# Patient Record
Sex: Female | Born: 1990 | Race: Black or African American | Hispanic: No | Marital: Single | Smoking: Former smoker
Health system: Southern US, Community
[De-identification: ages and names within clinical notes are randomized; demographics above are authoritative.]

## PROBLEM LIST (undated history)

## (undated) DIAGNOSIS — N186 End stage renal disease: Secondary | ICD-10-CM

## (undated) DIAGNOSIS — D649 Anemia, unspecified: Secondary | ICD-10-CM

## (undated) DIAGNOSIS — Z87891 Personal history of nicotine dependence: Secondary | ICD-10-CM

## (undated) DIAGNOSIS — R0600 Dyspnea, unspecified: Secondary | ICD-10-CM

## (undated) DIAGNOSIS — K219 Gastro-esophageal reflux disease without esophagitis: Secondary | ICD-10-CM

## (undated) DIAGNOSIS — I10 Essential (primary) hypertension: Secondary | ICD-10-CM

## (undated) DIAGNOSIS — Z992 Dependence on renal dialysis: Secondary | ICD-10-CM

## (undated) DIAGNOSIS — G4733 Obstructive sleep apnea (adult) (pediatric): Secondary | ICD-10-CM

## (undated) DIAGNOSIS — T7840XA Allergy, unspecified, initial encounter: Secondary | ICD-10-CM

## (undated) HISTORY — DX: Allergy, unspecified, initial encounter: T78.40XA

## (undated) HISTORY — DX: Anemia, unspecified: D64.9

---
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
## 2018-12-13 ENCOUNTER — Encounter: Payer: Self-pay | Admitting: Obstetrics and Gynecology

## 2018-12-13 ENCOUNTER — Other Ambulatory Visit: Payer: Self-pay

## 2018-12-13 ENCOUNTER — Ambulatory Visit (INDEPENDENT_AMBULATORY_CARE_PROVIDER_SITE_OTHER): Payer: PRIVATE HEALTH INSURANCE | Admitting: Obstetrics and Gynecology

## 2018-12-13 VITALS — BP 137/82 | HR 121 | Ht 66.0 in | Wt 301.8 lb

## 2018-12-13 DIAGNOSIS — O09891 Supervision of other high risk pregnancies, first trimester: Secondary | ICD-10-CM | POA: Diagnosis not present

## 2018-12-13 DIAGNOSIS — Z6841 Body Mass Index (BMI) 40.0 and over, adult: Secondary | ICD-10-CM

## 2018-12-13 DIAGNOSIS — N926 Irregular menstruation, unspecified: Secondary | ICD-10-CM | POA: Diagnosis not present

## 2018-12-13 DIAGNOSIS — Z98891 History of uterine scar from previous surgery: Secondary | ICD-10-CM

## 2018-12-13 NOTE — Progress Notes (Signed)
HPI:      Ms. Sierra Lee is a 28 y.o. [id number] who LMP was Patient's last menstrual period was 10/23/2018.  Subjective:   She presents today for pregnancy confirmation.  She believes she is approximately [redacted] weeks pregnant.  She has had 4 previous cesarean deliveries.  First delivery was complicated by the "size of her pelvis" and preeclampsia.  She has not had the diagnosis of preeclampsia since that first pregnancy. She is not taking prenatal vitamins. She describes a family history of diabetes. She has a very short interval between her previous pregnancy and this pregnancy. She is considering permanent sterilization or at the very least long-term contraception after this pregnancy.    Hx: The following portions of the patient's history were reviewed and updated as appropriate:             She  has a past medical history of Anemia. She does not have a problem list on file. She  has a past surgical history that includes Cesarean section. Her family history includes Breast cancer in her maternal aunt and maternal grandmother; Diabetes in her mother. She  reports that she quit smoking about 2 months ago. Her smoking use included cigarettes. She has never used smokeless tobacco. She reports previous alcohol use. She reports that she does not use drugs. She has a current medication list which includes the following prescription(s): ferrous sulfate. She has No Known Allergies.       Review of Systems:  Review of Systems  Constitutional: Denied constitutional symptoms, night sweats, recent illness, fatigue, fever, insomnia and weight loss.  Eyes: Denied eye symptoms, eye pain, photophobia, vision change and visual disturbance.  Ears/Nose/Throat/Neck: Denied ear, nose, throat or neck symptoms, hearing loss, nasal discharge, sinus congestion and sore throat.  Cardiovascular: Denied cardiovascular symptoms, arrhythmia, chest pain/pressure, edema, exercise intolerance, orthopnea and palpitations.   Respiratory: Denied pulmonary symptoms, asthma, pleuritic pain, productive sputum, cough, dyspnea and wheezing.  Gastrointestinal: Denied, gastro-esophageal reflux, melena, nausea and vomiting.  Genitourinary: Denied genitourinary symptoms including symptomatic vaginal discharge, pelvic relaxation issues, and urinary complaints.  Musculoskeletal: Denied musculoskeletal symptoms, stiffness, swelling, muscle weakness and myalgia.  Dermatologic: Denied dermatology symptoms, rash and scar.  Neurologic: Denied neurology symptoms, dizziness, headache, neck pain and syncope.  Psychiatric: Denied psychiatric symptoms, anxiety and depression.  Endocrine: Denied endocrine symptoms including hot flashes and night sweats.   Meds:   Current Outpatient Medications on File Prior to Visit  Medication Sig Dispense Refill  . ferrous sulfate 325 (65 FE) MG tablet Take 325 mg by mouth daily with breakfast.     No current facility-administered medications on file prior to visit.     Objective:     Vitals:   12/13/18 0817  BP: 137/82  Pulse: (!) 121              Urinary pregnancy test positive  Assessment:    [id number] There are no active problems to display for this patient.    1. Missed menses   2. Morbid obesity with BMI of 45.0-49.9, adult ([place])   3. History of Cesarean delivery X4   4. Short interval between pregnancies affecting pregnancy in first trimester, antepartum     Patient at high risk for gestational diabetes, increased risk for placenta previa/accreta.   Plan:             Prenatal Plan 1.  The patient was given prenatal literature. 2.  She was begun on prenatal vitamins. 3.  A prenatal lab  panel was ordered or drawn. 4.  An ultrasound was ordered to better determine an EDC. 5.  A nurse visit was scheduled. 6.  Genetic testing and testing for other inheritable conditions discussed in detail. She will decide in the future whether to have these labs performed. 7.  A  general overview of pregnancy testing, visit schedule, ultrasound schedule, and prenatal care was discussed. 8.  Benefits of breast-feeding discussed in detail including both maternal and infant benefits. Ready Set Baby website discussed.   Orders No orders of the defined types were placed in this encounter.   No orders of the defined types were placed in this encounter.     F/U  Return in about 5 weeks (around 01/17/2019). I spent 33 minutes involved in the care of this patient of which greater than 50% was spent discussing see above prenatal plan as well as increased risk for diabetes, problems with obesity, previous cesarean delivery x4 and previous obstetric history obtained, possible permanent sterilization versus long-term contraception discussed.  All questions answered.  Elonda Husky, M.D. 12/13/2018 8:55 AM

## 2018-12-21 ENCOUNTER — Other Ambulatory Visit: Payer: Self-pay

## 2018-12-25 ENCOUNTER — Telehealth: Payer: Self-pay

## 2018-12-25 NOTE — Telephone Encounter (Signed)
Early OB 9 weeks-   Out shopping  Went to the bathroom and had blood in your underwear. Dime size. No cramps. Last IC 10 days ago.  Light spotting when she wipes today. NO pain with urination. No issues with BM.   Took Ibup yesterday - slight discomfort in stomach. Better today.  Pt advised if spotting becomes heavy or cramps not relieved with tylenol she needs to contact the office or go to the ED. Pt voices understanding.

## 2018-12-26 ENCOUNTER — Ambulatory Visit (INDEPENDENT_AMBULATORY_CARE_PROVIDER_SITE_OTHER): Payer: PRIVATE HEALTH INSURANCE

## 2018-12-26 ENCOUNTER — Other Ambulatory Visit: Payer: Self-pay

## 2018-12-26 DIAGNOSIS — O09891 Supervision of other high risk pregnancies, first trimester: Secondary | ICD-10-CM | POA: Diagnosis not present

## 2018-12-26 DIAGNOSIS — N926 Irregular menstruation, unspecified: Secondary | ICD-10-CM | POA: Diagnosis not present

## 2018-12-26 DIAGNOSIS — Z98891 History of uterine scar from previous surgery: Secondary | ICD-10-CM

## 2019-01-17 ENCOUNTER — Encounter: Payer: Self-pay | Admitting: Obstetrics and Gynecology

## 2019-02-15 ENCOUNTER — Telehealth: Payer: Self-pay | Admitting: Obstetrics and Gynecology

## 2019-02-20 ENCOUNTER — Ambulatory Visit (INDEPENDENT_AMBULATORY_CARE_PROVIDER_SITE_OTHER): Payer: Medicaid Other | Admitting: Obstetrics and Gynecology

## 2019-02-20 ENCOUNTER — Other Ambulatory Visit: Payer: Self-pay

## 2019-02-20 ENCOUNTER — Encounter: Payer: Self-pay | Admitting: Obstetrics and Gynecology

## 2019-02-20 VITALS — BP 123/68 | HR 96 | Wt 318.2 lb

## 2019-02-20 DIAGNOSIS — O34219 Maternal care for unspecified type scar from previous cesarean delivery: Secondary | ICD-10-CM | POA: Diagnosis not present

## 2019-02-20 DIAGNOSIS — Z98891 History of uterine scar from previous surgery: Secondary | ICD-10-CM

## 2019-02-20 DIAGNOSIS — Z6841 Body Mass Index (BMI) 40.0 and over, adult: Secondary | ICD-10-CM

## 2019-02-20 DIAGNOSIS — Z3492 Encounter for supervision of normal pregnancy, unspecified, second trimester: Secondary | ICD-10-CM

## 2019-02-20 DIAGNOSIS — Z3A18 18 weeks gestation of pregnancy: Secondary | ICD-10-CM

## 2019-02-20 LAB — POCT URINALYSIS DIPSTICK OB
Bilirubin, UA: NEGATIVE
Glucose, UA: NEGATIVE
Ketones, UA: NEGATIVE
Leukocytes, UA: NEGATIVE
Nitrite, UA: POSITIVE
Spec Grav, UA: 1.01 (ref 1.010–1.025)
Urobilinogen, UA: 0.2 E.U./dL
pH, UA: 7 (ref 5.0–8.0)

## 2019-02-20 NOTE — Progress Notes (Signed)
NOB: Patient did not return for her new OB visit.  This is her first visit since confirmation.  She reports no problems.   Maternity 21, AFP today   FAS scheduled   Anesthesia consult scheduled  Physical examination General NAD, Conversant  HEENT Atraumatic; Op clear with mmm.  Normo-cephalic. Pupils reactive. Anicteric sclerae  Thyroid/Neck Smooth without nodularity or enlargement. Normal ROM.  Neck Supple.  Skin No rashes, lesions or ulceration. Normal palpated skin turgor. No nodularity.  Breasts: No masses or discharge.  Symmetric.  No axillary adenopathy.  Lungs: Clear to auscultation.No rales or wheezes. Normal Respiratory effort, no retractions.  Heart: NSR.  No murmurs or rubs appreciated. No periferal edema  Abdomen: Soft.  Non-tender.  No masses.  No HSM. No hernia  Extremities: Moves all appropriately.  Normal ROM for age. No lymphadenopathy.  Neuro: Oriented to PPT.  Normal mood. Normal affect.     Pelvic:   Vulva: Normal appearance.  No lesions.  Vagina: No lesions or abnormalities noted.  Support: Normal pelvic support.  Urethra No masses tenderness or scarring.  Meatus Normal size without lesions or prolapse.  Cervix: Normal appearance.  No lesions.  Anus: Normal exam.  No lesions.  Perineum: Normal exam.  No lesions.        Bimanual   Adnexae: No masses.  Non-tender to palpation.  Uterus: Enlarged. 18 wks  Non-tender.  Mobile.  AV.  Adnexae: No masses.  Non-tender to palpation.  Cul-de-sac: Negative for abnormality.  Adnexae: No masses.  Non-tender to palpation.         Pelvimetry   Diagonal: Reached.  Spines: Average.  Sacrum: Concave.  Pubic Arch: Normal.   The following were addressed during this visit:  Breastfeeding Education - Early initiation of breastfeeding    Comments: Keeps milk supply adequate, helps contract uterus and slow bleeding, and early milk is the perfect first food and is easy to digest.   - The importance of exclusive breastfeeding     Comments: Provides antibodies, Lower risk of breast and ovarian cancers, and type-2 diabetes,Helps your body recover, Reduced chance of SIDS.   - Exclusive breastfeeding for the first 6 months    Comments: Builds a healthy milk supply and keeps it up, protects baby from sickness and disease, and breastmilk has everything your baby needs for the first 6 months.  - Individualized Education    Comments: Contraindications to breastfeeding and other special medical conditions

## 2019-02-21 LAB — URINALYSIS, ROUTINE W REFLEX MICROSCOPIC
Bilirubin, UA: NEGATIVE
Glucose, UA: NEGATIVE
Ketones, UA: NEGATIVE
Leukocytes,UA: NEGATIVE
Nitrite, UA: POSITIVE — AB
RBC, UA: NEGATIVE
Specific Gravity, UA: 1.023 (ref 1.005–1.030)
Urobilinogen, Ur: 0.2 mg/dL (ref 0.2–1.0)
pH, UA: 6 (ref 5.0–7.5)

## 2019-02-21 LAB — MICROSCOPIC EXAMINATION: Casts: NONE SEEN /lpf

## 2019-02-21 LAB — MONITOR DRUG PROFILE 14(MW)
Amphetamine Scrn, Ur: NEGATIVE ng/mL
BARBITURATE SCREEN URINE: NEGATIVE ng/mL
BENZODIAZEPINE SCREEN, URINE: NEGATIVE ng/mL
Buprenorphine, Urine: NEGATIVE ng/mL
CANNABINOIDS UR QL SCN: NEGATIVE ng/mL
Cocaine (Metab) Scrn, Ur: NEGATIVE ng/mL
Creatinine(Crt), U: 225.6 mg/dL (ref 20.0–300.0)
Fentanyl, Urine: NEGATIVE pg/mL
Meperidine Screen, Urine: NEGATIVE ng/mL
Methadone Screen, Urine: NEGATIVE ng/mL
OXYCODONE+OXYMORPHONE UR QL SCN: NEGATIVE ng/mL
Opiate Scrn, Ur: NEGATIVE ng/mL
Ph of Urine: 5.8 (ref 4.5–8.9)
Phencyclidine Qn, Ur: NEGATIVE ng/mL
Propoxyphene Scrn, Ur: NEGATIVE ng/mL
SPECIFIC GRAVITY: 1.021
Tramadol Screen, Urine: NEGATIVE ng/mL

## 2019-02-21 LAB — NICOTINE SCREEN, URINE: Cotinine Ql Scrn, Ur: NEGATIVE ng/mL

## 2019-02-22 LAB — ANTIBODY SCREEN: Antibody Screen: NEGATIVE

## 2019-02-22 LAB — AFP, SERUM, OPEN SPINA BIFIDA
AFP MoM: 0.99
AFP Value: 30.9 ng/mL
Gest. Age on Collection Date: 18 weeks
Maternal Age At EDD: 26.5 yr
OSBR Risk 1 IN: 10000
Test Results:: NEGATIVE
Weight: 318 [lb_av]

## 2019-02-22 LAB — CBC WITH DIFFERENTIAL/PLATELET
Basophils Absolute: 0 10*3/uL (ref 0.0–0.2)
Basos: 0 %
EOS (ABSOLUTE): 0.1 10*3/uL (ref 0.0–0.4)
Eos: 1 %
Hematocrit: 32.9 % — ABNORMAL LOW (ref 34.0–46.6)
Hemoglobin: 10.6 g/dL — ABNORMAL LOW (ref 11.1–15.9)
Immature Grans (Abs): 0 10*3/uL (ref 0.0–0.1)
Immature Granulocytes: 0 %
Lymphocytes Absolute: 2.9 10*3/uL (ref 0.7–3.1)
Lymphs: 26 %
MCH: 25.6 pg — ABNORMAL LOW (ref 26.6–33.0)
MCHC: 32.2 g/dL (ref 31.5–35.7)
MCV: 80 fL (ref 79–97)
Monocytes Absolute: 0.6 10*3/uL (ref 0.1–0.9)
Monocytes: 5 %
Neutrophils Absolute: 7.5 10*3/uL — ABNORMAL HIGH (ref 1.4–7.0)
Neutrophils: 68 %
Platelets: 342 10*3/uL (ref 150–450)
RBC: 4.14 x10E6/uL (ref 3.77–5.28)
RDW: 15.8 % — ABNORMAL HIGH (ref 11.7–15.4)
WBC: 11.1 10*3/uL — ABNORMAL HIGH (ref 3.4–10.8)

## 2019-02-22 LAB — HIV ANTIBODY (ROUTINE TESTING W REFLEX): HIV Screen 4th Generation wRfx: NONREACTIVE

## 2019-02-22 LAB — RUBELLA SCREEN: Rubella Antibodies, IGG: 2.7 index (ref >0.99)

## 2019-02-22 LAB — VARICELLA ZOSTER ANTIBODY, IGG: Varicella zoster IgG: <135 index — ABNORMAL LOW (ref >165)

## 2019-02-22 LAB — HEPATITIS B SURFACE ANTIGEN: Hepatitis B Surface Ag: NEGATIVE

## 2019-02-22 LAB — GC/CHLAMYDIA PROBE AMP
Chlamydia trachomatis, NAA: NEGATIVE
Neisseria Gonorrhoeae by PCR: NEGATIVE

## 2019-02-22 LAB — ABO AND RH: Rh Factor: POSITIVE

## 2019-02-22 LAB — RPR: RPR Ser Ql: NONREACTIVE

## 2019-02-22 LAB — HGB SOLU + RFLX FRAC: Sickle Solubility Test - HGBRFX: NEGATIVE

## 2019-02-23 LAB — URINE CULTURE, OB REFLEX

## 2019-02-23 LAB — CULTURE, OB URINE

## 2019-02-27 NOTE — Telephone Encounter (Signed)
na

## 2019-02-28 ENCOUNTER — Other Ambulatory Visit: Payer: Self-pay | Admitting: Surgical

## 2019-02-28 LAB — MATERNIT21  PLUS CORE+ESS+SCA, BLOOD

## 2019-02-28 MED ORDER — NITROFURANTOIN MONOHYD MACRO 100 MG PO CAPS: 100.0000 mg | ORAL_CAPSULE | Freq: Two times a day (BID) | ORAL | 0 refills | Status: DC

## 2019-03-06 ENCOUNTER — Other Ambulatory Visit: Payer: PRIVATE HEALTH INSURANCE

## 2019-03-13 ENCOUNTER — Other Ambulatory Visit: Payer: PRIVATE HEALTH INSURANCE

## 2019-03-16 NOTE — L&D Delivery Note (Signed)
Delivery Summary for [facility]  Labor Events:   Preterm labor: No data found  Rupture date: No data found  Rupture time: No data found  Rupture type: No data found  Fluid Color: No data found  Induction: No data found  Augmentation: No data found  Complications: No data found  Cervical ripening: No data found No data found   No data found     Delivery:   Episiotomy: No data found  Lacerations: No data found  Repair suture: No data found  Repair # of packets: No data found  Blood loss (ml): No data found   Information for the patient's newborn:  Saliha, [place] [[id number]]    Delivery 07/16/2019 11:12 AM by  C-Section, Low Transverse Sex:  female Gestational Age: [redacted]w[redacted]d Delivery Clinician:   Living?:         APGARS  One minute Five minutes Ten minutes  Skin color:        Heart rate:        Grimace:        Muscle tone:        Breathing:        Totals: 6  7  8     Presentation/position:      Resuscitation:   Cord information:    Disposition of cord blood:     Blood gases sent?  Complications:   Placenta: Delivered:       appearance Newborn Measurements: Weight: 7 lb 12.9 oz (3540 g)  Height:    Head circumference:    Chest circumference:    Other providers:    Additional  information: Forceps:   Vacuum:   Breech:   Observed anomalies       See Dr. operative note for details of C-section.    Oretha Milch, MD [facility]

## 2019-03-20 ENCOUNTER — Encounter: Payer: PRIVATE HEALTH INSURANCE | Admitting: Obstetrics and Gynecology

## 2019-03-27 ENCOUNTER — Encounter
Admission: RE | Admit: 2019-03-27 | Discharge: 2019-03-27 | Disposition: A | Discharge status: Home / Self Care | Payer: PRIVATE HEALTH INSURANCE | Source: Ambulatory Visit | Attending: Anesthesiology | Admitting: Anesthesiology

## 2019-03-27 ENCOUNTER — Other Ambulatory Visit: Payer: Self-pay

## 2019-03-27 NOTE — Consult Note (Signed)
[facility] Anesthesia Consultation  Sierra Lee YKZ:[id number] DOB: 1990-10-16 DOA: 03/27/2019 PCP: Patient, No Pcp Per   Requesting physician: Dr. Logan Bores Date of consultation: 03/27/19 Reason for consultation: Obesity during pregnancy  CHIEF COMPLAINT:  Obesity during pregnancy  HISTORY OF PRESENT ILLNESS: Sierra Lee  is a 29 y.o. female with a known history of obesity in pregnancy. She has had 4 prior cesarean deliveries (all done in [place]), planning for repeat cesarean delivery. Denies hx of cardiovascular disease. Denies hx of asthma. Denies personal or family hx of bleeding disorders.  PAST MEDICAL HISTORY:   Past Medical History:  Diagnosis Date  . Anemia     PAST SURGICAL HISTORY:  Past Surgical History:  Procedure Laterality Date  . CESAREAN SECTION      SOCIAL HISTORY:  Social History   Tobacco Use  . Smoking status: Former Smoker    Types: Cigarettes    Quit date: 10/12/2018    Years since quitting: 0.4  . Smokeless tobacco: Never Used  Substance Use Topics  . Alcohol use: Not Currently    FAMILY HISTORY:  Family History  Problem Relation Age of Onset  . Diabetes Mother   . Breast cancer Maternal Aunt   . Breast cancer Maternal Grandmother     DRUG ALLERGIES: No Known Allergies  REVIEW OF SYSTEMS:   RESPIRATORY: No cough, shortness of breath, wheezing.  CARDIOVASCULAR: No chest pain, orthopnea, edema.  HEMATOLOGY: No anemia, easy bruising or bleeding SKIN: No rash or lesion. NEUROLOGIC: No tingling, numbness, weakness.  PSYCHIATRY: No anxiety or depression.   MEDICATIONS AT HOME:  Prior to Admission medications   Medication Sig Start Date End Date Taking? Authorizing Provider  ferrous sulfate 325 (65 FE) MG tablet Take 325 mg by mouth daily with breakfast.    [provider]  nitrofurantoin, macrocrystal-monohydrate, (MACROBID) 100 MG capsule Take 1 capsule (100 mg total) by mouth 2 (two)  times daily. 02/28/19   Linzie Collin, MD  Prenatal Vit-Fe Fumarate-FA (PRENATAL MULTIVITAMIN) TABS tablet Take 1 tablet by mouth daily at 12 noon.    [provider]      PHYSICAL EXAMINATION:   VITAL SIGNS: Last menstrual period 10/23/2018.  GENERAL:  29 y.o.-year-old patient no acute distress.  HEENT: Head atraumatic, normocephalic. Oropharynx and nasopharynx clear. MP 2, TM distance >3 cm, normal mouth opening, grade 1 upper lip bite. LUNGS: Normal breath sounds bilaterally, no wheezing, rales,rhonchi. No use of accessory muscles of respiration.  CARDIOVASCULAR: S1, S2 normal. No murmurs, rubs, or gallops.  EXTREMITIES: No pedal edema, cyanosis, or clubbing.  NEUROLOGIC: normal gait PSYCHIATRIC: The patient is alert and oriented x 3.  SKIN: No obvious rash, lesion, or ulcer.    IMPRESSION AND PLAN:   Sierra Lee  is a 28 y.o. female presenting with obesity during pregnancy. BMI is currently 52 at [redacted] weeks gestation.   Airway exam reassuring today. Spinal interspaces minimally palpable.  Discussed plan for spinal anesthesia for repeat cesarean delivery. Discussed increased risk of difficult intubation during pregnancy should an emergency cesarean delivery be required.   We discussed repeat evaluation at 34 weeks by anesthesia to determine whether there is a high risk of complications of anesthesia for which we would recommend transfer of OB care to a facility with a higher maternal level of care designation.

## 2019-04-03 ENCOUNTER — Encounter: Payer: Medicaid Other | Admitting: Obstetrics and Gynecology

## 2019-04-03 ENCOUNTER — Other Ambulatory Visit: Payer: Self-pay

## 2019-04-04 ENCOUNTER — Encounter: Payer: PRIVATE HEALTH INSURANCE | Admitting: Obstetrics and Gynecology

## 2019-04-12 NOTE — Progress Notes (Deleted)
Rob-Pt present for routine prenatal care.

## 2019-04-13 ENCOUNTER — Encounter: Payer: PRIVATE HEALTH INSURANCE | Admitting: Obstetrics and Gynecology

## 2019-04-16 ENCOUNTER — Telehealth: Payer: Self-pay | Admitting: Obstetrics and Gynecology

## 2019-04-16 NOTE — Telephone Encounter (Signed)
Pt called in the pt missed there appt and the pt is off this wed. The pt was wanting to come in but do [facility] is full. where would you want to out her? She did say she can do the 16th but the pt is 26 weeks.  Please advise

## 2019-04-18 NOTE — Telephone Encounter (Signed)
Pt called no answer no vm picked up.

## 2019-04-23 NOTE — Telephone Encounter (Signed)
Pt called to remind pt of appointment and to schedule an u/s for anatomy that she missed.

## 2019-04-30 NOTE — Patient Instructions (Addendum)
Third Trimester of Pregnancy  The third trimester is from week 28 through week 40 (months 7 through 9). This trimester is when your unborn baby (fetus) is growing very fast. At the end of the ninth month, the unborn baby is about 20 inches in length. It weighs about 6-10 pounds. Follow these instructions at home: Medicines  Take over-the-counter and prescription medicines only as told by your doctor. Some medicines are safe and some medicines are not safe during pregnancy.  Take a prenatal vitamin that contains at least 600 micrograms (mcg) of folic acid.  If you have trouble pooping (constipation), take medicine that will make your stool soft (stool softener) if your doctor approves. Eating and drinking   Eat regular, healthy meals.  Avoid raw meat and uncooked cheese.  If you get low calcium from the food you eat, talk to your doctor about taking a daily calcium supplement.  Eat four or five small meals rather than three large meals a day.  Avoid foods that are high in fat and sugars, such as fried and sweet foods.  To prevent constipation: ? Eat foods that are high in fiber, like fresh fruits and vegetables, whole grains, and beans. ? Drink enough fluids to keep your pee (urine) clear or pale yellow. Activity  Exercise only as told by your doctor. Stop exercising if you start to have cramps.  Avoid heavy lifting, wear low heels, and sit up straight.  Do not exercise if it is too hot, too humid, or if you are in a place of great height (high altitude).  You may continue to have sex unless your doctor tells you not to. Relieving pain and discomfort  Wear a good support bra if your breasts are tender.  Take frequent breaks and rest with your legs raised if you have leg cramps or low back pain.  Take warm water baths (sitz baths) to soothe pain or discomfort caused by hemorrhoids. Use hemorrhoid cream if your doctor approves.  If you develop puffy, bulging veins (varicose  veins) in your legs: ? Wear support hose or compression stockings as told by your doctor. ? Raise (elevate) your feet for 15 minutes, 3-4 times a day. ? Limit salt in your food. Safety  Wear your seat belt when driving.  Make a list of emergency phone numbers, including numbers for family, friends, the hospital, and police and fire departments. Preparing for your baby's arrival To prepare for the arrival of your baby:  Take prenatal classes.  Practice driving to the hospital.  Visit the hospital and tour the maternity area.  Talk to your work about taking leave once the baby comes.  Pack your hospital bag.  Prepare the baby's room.  Go to your doctor visits.  Buy a rear-facing car seat. Learn how to install it in your car. General instructions  Do not use hot tubs, steam rooms, or saunas.  Do not use any products that contain nicotine or tobacco, such as cigarettes and e-cigarettes. If you need help quitting, ask your doctor.  Do not drink alcohol.  Do not douche or use tampons or scented sanitary pads.  Do not cross your legs for long periods of time.  Do not travel for long distances unless you must. Only do so if your doctor says it is okay.  Visit your dentist if you have not gone during your pregnancy. Use a soft toothbrush to brush your teeth. Be gentle when you floss.  Avoid cat litter boxes and soil   used by cats. These carry germs that can cause birth defects in the baby and can cause a loss of your baby (miscarriage) or stillbirth.  Keep all your prenatal visits as told by your doctor. This is important. Contact a doctor if:  You are not sure if you are in labor or if your water has broken.  You are dizzy.  You have mild cramps or pressure in your lower belly.  You have a nagging pain in your belly area.  You continue to feel sick to your stomach, you throw up, or you have watery poop.  You have bad smelling fluid coming from your vagina.  You have  pain when you pee. Get help right away if:  You have a fever.  You are leaking fluid from your vagina.  You are spotting or bleeding from your vagina.  You have severe belly cramps or pain.  You lose or gain weight quickly.  You have trouble catching your breath and have chest pain.  You notice sudden or extreme puffiness (swelling) of your face, hands, ankles, feet, or legs.  You have not felt the baby move in over an hour.  You have severe headaches that do not go away with medicine.  You have trouble seeing.  You are leaking, or you are having a gush of fluid, from your vagina before you are 37 weeks.  You have regular belly spasms (contractions) before you are 37 weeks. Summary  The third trimester is from week 28 through week 40 (months 7 through 9). This time is when your unborn baby is growing very fast.  Follow your doctor's advice about medicine, food, and activity.  Get ready for the arrival of your baby by taking prenatal classes, getting all the baby items ready, preparing the baby's room, and visiting your doctor to be checked.  Get help right away if you are bleeding from your vagina, or you have chest pain and trouble catching your breath, or if you have not felt your baby move in over an hour. This information is not intended to replace advice given to you by your health care provider. Make sure you discuss any questions you have with your health care provider. Document Revised: 06/22/2018 Document Reviewed: 04/06/2016 Elsevier Patient Education  2020 [employer]. Breastfeeding and Self-Care It is normal to have some problems when you start to breastfeed your new baby. But there are things that you can do to take care of yourself and help prevent many common problems. This includes keeping your breasts healthy and making sure that your baby's mouth attaches (latches) properly to your nipple for feedings. Work with your doctor or breastfeeding specialist  (Advertising copywriter) to find what works best for you. Follow these instructions at home: Breastfeeding strategy   Always make sure that your baby latches properly to breastfeed.  Make sure that your baby is in a proper position. Try different breastfeeding positions to find one that works best for you and your baby.  Breastfeed when you feel like you need to make your breasts less full or when your baby shows signs of hunger. This is called "breastfeeding on demand."  Do not delay feedings.  Try to relax when it is time to feed your baby. This helps your body release milk from your breast.  To help increase milk flow: ? Remove a small amount of milk from your breast right before breastfeeding. Do this using a pump or by squeezing with your hand. ? Apply warm,  moist heat to your breast right before feeding. You can do this in the shower or with hand towels soaked with warm water. ? Massage your breast right before or during feeding. Breast care   To help your breasts stay healthy and keep them from getting too dry: ? Avoid using soap on your nipples. ? Let your nipples air-dry for 3-4 minutes after each feeding. ? Use only cotton bra pads to soak up breast milk that leaks. Be sure to change the pads if they become soaked with milk. If you use bra pads that can be thrown away, change them often. ? Put some lanolin on your nipples after breastfeeding. Pure lanolin does not need to be washed off your nipple before you feed your baby again. Pure lanolin is not harmful to your baby. ? Rub some breast milk into your nipples:  Use your hand to squeeze out a few drops of breast milk.  Gently massage the milk into your nipples.  Let your nipples air-dry.  Wear a supportive nursing bra. Avoid wearing: ? Tight clothing. ? Underwire bras or bras that put pressure on your breasts.  Use ice to help relieve pain or swelling of your breasts: ? Put ice in a plastic bag. ? Place a towel  between your skin and the bag. ? Leave the ice on for 20 minutes, 2-3 times a day. General instructions  Drink enough fluid to keep your pee (urine) pale yellow.  Get plenty of rest. Sleep when your baby sleeps.  Talk to your doctor or breastfeeding specialist before taking any herbal supplements. Contact a health care provider if:  You have nipple pain.  You have cracking or soreness in your nipples that lasts longer than 1 week.  Your breasts are overfilled with milk (engorgement) and this lasts longer than 48 hours.  You have a fever.  You have pus-like fluid coming from your nipple.  You have redness, a rash, swelling, itching, or burning on your breast.  Your baby does not gain weight.  Your baby loses weight. Summary  There are things that you can do to take care of yourself and help prevent many common breastfeeding problems.  Always make sure that your baby's mouth attaches (latches) to your nipple properly to breastfeed.  Keep your nipples from getting too dry, drink plenty of fluid, and get plenty of rest.  Feed on demand. Do not delay feedings. This information is not intended to replace advice given to you by your health care provider. Make sure you discuss any questions you have with your health care provider. Document Revised: 06/21/2018 Document Reviewed: 10/06/2016 Elsevier Patient Education  2020 [employer] Breastfeeding  Choosing to breastfeed is one of the best decisions you can make for yourself and your baby. A change in hormones during pregnancy causes your breasts to make breast milk in your milk-producing glands. Hormones prevent breast milk from being released before your baby is born. They also prompt milk flow after birth. Once breastfeeding has begun, thoughts of your baby, as well as his or her sucking or crying, can stimulate the release of milk from your milk-producing glands. Benefits of breastfeeding Research shows that breastfeeding  offers many health benefits for infants and mothers. It also offers a cost-free and convenient way to feed your baby. For your baby  Your first milk (colostrum) helps your baby's digestive system to function better.  Special cells in your milk (antibodies) help your baby to fight off infections.  Breastfed babies  are less likely to develop asthma, allergies, obesity, or type 2 diabetes. They are also at lower risk for sudden infant death syndrome (SIDS).  Nutrients in breast milk are better able to meet your baby's needs compared to infant formula.  Breast milk improves your baby's brain development. For you  Breastfeeding helps to create a very special bond between you and your baby.  Breastfeeding is convenient. Breast milk costs nothing and is always available at the correct temperature.  Breastfeeding helps to burn calories. It helps you to lose the weight that you gained during pregnancy.  Breastfeeding makes your uterus return faster to its size before pregnancy. It also slows bleeding (lochia) after you give birth.  Breastfeeding helps to lower your risk of developing type 2 diabetes, osteoporosis, rheumatoid arthritis, cardiovascular disease, and breast, ovarian, uterine, and endometrial cancer later in life. Breastfeeding basics Starting breastfeeding  Find a comfortable place to sit or lie down, with your neck and back well-supported.  Place a pillow or a rolled-up blanket under your baby to bring him or her to the level of your breast (if you are seated). Nursing pillows are specially designed to help support your arms and your baby while you breastfeed.  Make sure that your baby's tummy (abdomen) is facing your abdomen.  Gently massage your breast. With your fingertips, massage from the outer edges of your breast inward toward the nipple. This encourages milk flow. If your milk flows slowly, you may need to continue this action during the feeding.  Support your breast  with 4 fingers underneath and your thumb above your nipple (make the letter "C" with your hand). Make sure your fingers are well away from your nipple and your baby's mouth.  Stroke your baby's lips gently with your finger or nipple.  When your baby's mouth is open wide enough, quickly bring your baby to your breast, placing your entire nipple and as much of the areola as possible into your baby's mouth. The areola is the colored area around your nipple. ? More areola should be visible above your baby's upper lip than below the lower lip. ? Your baby's lips should be opened and extended outward (flanged) to ensure an adequate, comfortable latch. ? Your baby's tongue should be between his or her lower gum and your breast.  Make sure that your baby's mouth is correctly positioned around your nipple (latched). Your baby's lips should create a seal on your breast and be turned out (everted).  It is common for your baby to suck about 2-3 minutes in order to start the flow of breast milk. Latching Teaching your baby how to latch onto your breast properly is very important. An improper latch can cause nipple pain, decreased milk supply, and poor weight gain in your baby. Also, if your baby is not latched onto your nipple properly, he or she may swallow some air during feeding. This can make your baby fussy. Burping your baby when you switch breasts during the feeding can help to get rid of the air. However, teaching your baby to latch on properly is still the best way to prevent fussiness from swallowing air while breastfeeding. Signs that your baby has successfully latched onto your nipple  Silent tugging or silent sucking, without causing you pain. Infant's lips should be extended outward (flanged).  Swallowing heard between every 3-4 sucks once your milk has started to flow (after your let-down milk reflex occurs).  Muscle movement above and in front of his or her  ears while sucking. Signs that your  baby has not successfully latched onto your nipple  Sucking sounds or smacking sounds from your baby while breastfeeding.  Nipple pain. If you think your baby has not latched on correctly, slip your finger into the corner of your baby's mouth to break the suction and place it between your baby's gums. Attempt to start breastfeeding again. Signs of successful breastfeeding Signs from your baby  Your baby will gradually decrease the number of sucks or will completely stop sucking.  Your baby will fall asleep.  Your baby's body will relax.  Your baby will retain a small amount of milk in his or her mouth.  Your baby will let go of your breast by himself or herself. Signs from you  Breasts that have increased in firmness, weight, and size 1-3 hours after feeding.  Breasts that are softer immediately after breastfeeding.  Increased milk volume, as well as a change in milk consistency and color by the fifth day of breastfeeding.  Nipples that are not sore, cracked, or bleeding. Signs that your baby is getting enough milk  Wetting at least 1-2 diapers during the first 24 hours after birth.  Wetting at least 5-6 diapers every 24 hours for the first week after birth. The urine should be clear or pale yellow by the age of 5 days.  Wetting 6-8 diapers every 24 hours as your baby continues to grow and develop.  At least 3 stools in a 24-hour period by the age of 5 days. The stool should be soft and yellow.  At least 3 stools in a 24-hour period by the age of 7 days. The stool should be seedy and yellow.  No loss of weight greater than 10% of birth weight during the first 3 days of life.  Average weight gain of 4-7 oz (113-198 g) per week after the age of 4 days.  Consistent daily weight gain by the age of 5 days, without weight loss after the age of 2 weeks. After a feeding, your baby may spit up a small amount of milk. This is normal. Breastfeeding frequency and duration Frequent  feeding will help you make more milk and can prevent sore nipples and extremely full breasts (breast engorgement). Breastfeed when you feel the need to reduce the fullness of your breasts or when your baby shows signs of hunger. This is called "breastfeeding on demand." Signs that your baby is hungry include:  Increased alertness, activity, or restlessness.  Movement of the head from side to side.  Opening of the mouth when the corner of the mouth or cheek is stroked (rooting).  Increased sucking sounds, smacking lips, cooing, sighing, or squeaking.  Hand-to-mouth movements and sucking on fingers or hands.  Fussing or crying. Avoid introducing a pacifier to your baby in the first 4-6 weeks after your baby is born. After this time, you may choose to use a pacifier. Research has shown that pacifier use during the first year of a baby's life decreases the risk of sudden infant death syndrome (SIDS). Allow your baby to feed on each breast as long as he or she wants. When your baby unlatches or falls asleep while feeding from the first breast, offer the second breast. Because newborns are often sleepy in the first few weeks of life, you may need to awaken your baby to get him or her to feed. Breastfeeding times will vary from baby to baby. However, the following rules can serve as a guide to  help you make sure that your baby is properly fed:  Newborns (babies 63 weeks of age or younger) may breastfeed every 1-3 hours.  Newborns should not go without breastfeeding for longer than 3 hours during the day or 5 hours during the night.  You should breastfeed your baby a minimum of 8 times in a 24-hour period. Breast milk pumping     Pumping and storing breast milk allows you to make sure that your baby is exclusively fed your breast milk, even at times when you are unable to breastfeed. This is especially important if you go back to work while you are still breastfeeding, or if you are not able to be  present during feedings. Your lactation consultant can help you find a method of pumping that works best for you and give you guidelines about how long it is safe to store breast milk. Caring for your breasts while you breastfeed Nipples can become dry, cracked, and sore while breastfeeding. The following recommendations can help keep your breasts moisturized and healthy:  Avoid using soap on your nipples.  Wear a supportive bra designed especially for nursing. Avoid wearing underwire-style bras or extremely tight bras (sports bras).  Air-dry your nipples for 3-4 minutes after each feeding.  Use only cotton bra pads to absorb leaked breast milk. Leaking of breast milk between feedings is normal.  Use lanolin on your nipples after breastfeeding. Lanolin helps to maintain your skin's normal moisture barrier. Pure lanolin is not harmful (not toxic) to your baby. You may also hand express a few drops of breast milk and gently massage that milk into your nipples and allow the milk to air-dry. In the first few weeks after giving birth, some women experience breast engorgement. Engorgement can make your breasts feel heavy, warm, and tender to the touch. Engorgement peaks within 3-5 days after you give birth. The following recommendations can help to ease engorgement:  Completely empty your breasts while breastfeeding or pumping. You may want to start by applying warm, moist heat (in the shower or with warm, water-soaked hand towels) just before feeding or pumping. This increases circulation and helps the milk flow. If your baby does not completely empty your breasts while breastfeeding, pump any extra milk after he or she is finished.  Apply ice packs to your breasts immediately after breastfeeding or pumping, unless this is too uncomfortable for you. To do this: ? Put ice in a plastic bag. ? Place a towel between your skin and the bag. ? Leave the ice on for 20 minutes, 2-3 times a day.  Make sure  that your baby is latched on and positioned properly while breastfeeding. If engorgement persists after 48 hours of following these recommendations, contact your health care provider or a Advertising copywriter. Overall health care recommendations while breastfeeding  Eat 3 healthy meals and 3 snacks every day. Well-nourished mothers who are breastfeeding need an additional 450-500 calories a day. You can meet this requirement by increasing the amount of a balanced diet that you eat.  Drink enough water to keep your urine pale yellow or clear.  Rest often, relax, and continue to take your prenatal vitamins to prevent fatigue, stress, and low vitamin and mineral levels in your body (nutrient deficiencies).  Do not use any products that contain nicotine or tobacco, such as cigarettes and e-cigarettes. Your baby may be harmed by chemicals from cigarettes that pass into breast milk and exposure to secondhand smoke. If you need help quitting, ask your  health care provider.  Avoid alcohol.  Do not use illegal drugs or marijuana.  Talk with your health care provider before taking any medicines. These include over-the-counter and prescription medicines as well as vitamins and herbal supplements. Some medicines that may be harmful to your baby can pass through breast milk.  It is possible to become pregnant while breastfeeding. If birth control is desired, ask your health care provider about options that will be safe while breastfeeding your baby. Where to find more information: [employer]: www.llli.org Contact a health care provider if:  You feel like you want to stop breastfeeding or have become frustrated with breastfeeding.  Your nipples are cracked or bleeding.  Your breasts are red, tender, or warm.  You have: ? Painful breasts or nipples. ? A swollen area on either breast. ? A fever or chills. ? Nausea or vomiting. ? Drainage other than breast milk from your  nipples.  Your breasts do not become full before feedings by the fifth day after you give birth.  You feel sad and depressed.  Your baby is: ? Too sleepy to eat well. ? Having trouble sleeping. ? More than 45 week old and wetting fewer than 6 diapers in a 24-hour period. ? Not gaining weight by 60 days of age.  Your baby has fewer than 3 stools in a 24-hour period.  Your baby's skin or the white parts of his or her eyes become yellow. Get help right away if:  Your baby is overly tired (lethargic) and does not want to wake up and feed.  Your baby develops an unexplained fever. Summary  Breastfeeding offers many health benefits for infant and mothers.  Try to breastfeed your infant when he or she shows early signs of hunger.  Gently tickle or stroke your baby's lips with your finger or nipple to allow the baby to open his or her mouth. Bring the baby to your breast. Make sure that much of the areola is in your baby's mouth. Offer one side and burp the baby before you offer the other side.  Talk with your health care provider or lactation consultant if you have questions or you face problems as you breastfeed. This information is not intended to replace advice given to you by your health care provider. Make sure you discuss any questions you have with your health care provider. Document Revised: 05/26/2017 Document Reviewed: 04/02/2016 Elsevier Patient Education  2020 [employer].

## 2019-05-01 ENCOUNTER — Other Ambulatory Visit (HOSPITAL_COMMUNITY)
Admission: RE | Admit: 2019-05-01 | Discharge: 2019-05-01 | Disposition: A | Discharge status: Home / Self Care | Payer: Medicaid Other | Source: Ambulatory Visit | Attending: Obstetrics and Gynecology | Admitting: Obstetrics and Gynecology

## 2019-05-01 ENCOUNTER — Ambulatory Visit (INDEPENDENT_AMBULATORY_CARE_PROVIDER_SITE_OTHER): Payer: Medicaid Other | Admitting: Obstetrics and Gynecology

## 2019-05-01 ENCOUNTER — Encounter: Payer: Self-pay | Admitting: Obstetrics and Gynecology

## 2019-05-01 ENCOUNTER — Ambulatory Visit (INDEPENDENT_AMBULATORY_CARE_PROVIDER_SITE_OTHER): Payer: Medicaid Other

## 2019-05-01 ENCOUNTER — Other Ambulatory Visit: Payer: Self-pay

## 2019-05-01 VITALS — BP 114/74 | HR 105 | Wt 319.9 lb

## 2019-05-01 DIAGNOSIS — E66813 Obesity, class 3: Secondary | ICD-10-CM | POA: Insufficient documentation

## 2019-05-01 DIAGNOSIS — O4690 Antepartum hemorrhage, unspecified, unspecified trimester: Secondary | ICD-10-CM

## 2019-05-01 DIAGNOSIS — O0932 Supervision of pregnancy with insufficient antenatal care, second trimester: Secondary | ICD-10-CM

## 2019-05-01 DIAGNOSIS — R399 Unspecified symptoms and signs involving the genitourinary system: Secondary | ICD-10-CM

## 2019-05-01 DIAGNOSIS — O0992 Supervision of high risk pregnancy, unspecified, second trimester: Secondary | ICD-10-CM

## 2019-05-01 DIAGNOSIS — O0993 Supervision of high risk pregnancy, unspecified, third trimester: Secondary | ICD-10-CM | POA: Insufficient documentation

## 2019-05-01 DIAGNOSIS — Z3482 Encounter for supervision of other normal pregnancy, second trimester: Secondary | ICD-10-CM | POA: Insufficient documentation

## 2019-05-01 DIAGNOSIS — Z3492 Encounter for supervision of normal pregnancy, unspecified, second trimester: Secondary | ICD-10-CM | POA: Diagnosis not present

## 2019-05-01 DIAGNOSIS — Z308 Encounter for other contraceptive management: Secondary | ICD-10-CM

## 2019-05-01 DIAGNOSIS — Z6841 Body Mass Index (BMI) 40.0 and over, adult: Secondary | ICD-10-CM

## 2019-05-01 DIAGNOSIS — Z98891 History of uterine scar from previous surgery: Secondary | ICD-10-CM

## 2019-05-01 LAB — POCT URINALYSIS DIPSTICK OB
Bilirubin, UA: NEGATIVE
Glucose, UA: NEGATIVE
Ketones, UA: NEGATIVE
Leukocytes, UA: NEGATIVE
Nitrite, UA: NEGATIVE
Spec Grav, UA: 1.015 (ref 1.010–1.025)
Urobilinogen, UA: 0.2 E.U./dL
pH, UA: 7.5 (ref 5.0–8.0)

## 2019-05-01 MED ORDER — TETANUS-DIPHTH-ACELL PERTUSSIS 5-2.5-18.5 LF-MCG/0.5 IM SUSP
0.5000 mL | Freq: Once | INTRAMUSCULAR | Status: AC
  Administered 2019-05-01: 15:00:00 0.5 mL via INTRAMUSCULAR

## 2019-05-01 NOTE — Progress Notes (Signed)
ROB: Patient has not been seen since [redacted] weeks gestation. Patient complains of spotting/light bleeding when wiping. Has been ongoing since this last night and was associated with cramping. No intercourse in several months, denies vaginal discharge. Placed a tampon overnight. Notes the bleeding has stopped. Pelvic exam with scant brown discharge, cervix closed, no lesions. UA today with protein and trace blood. Had h/o UTI in December, notes she did not complete antibiotic course. Will perform culture and treat as needed. For 28 week labs today (however can not stay for 1 hr glucola so will return next week). Tdap given. Blood consent signed. Declines flu. Desires to bottle feed, but willing to consider breastfeeding. Ready Set Baby info given. Seen by lactation student today. Discussed contraception, desires BTL.  Medicaid form completed. S/p anatomy scan today, needs f/u in 2 weeks for incomplete anatomy.  RTC in 2 weeks. Possible scan due again at 34-36 weeks to assess for placentation (high risk of abnormalities due to h/o prior C/S x 4). Has had initial anesthesia consult. Needs repeat evaluation at 34 weeks. Discussed female circumcision, given info on clinic.

## 2019-05-01 NOTE — Progress Notes (Signed)
ROB-pt present for routine prenatal care and anatomy scan. Pt called this morning stating that she was having moderate vaginal bleeding and she was using a tampon and cramping. Pt stated that she noticed the blood last night and this morning. Pt stated that the bleeding has stopped. Pt declined 1 hour gt today and was placed on schedule next week for labs. BTC/Tdap completed.

## 2019-05-06 LAB — URINE CULTURE

## 2019-05-16 ENCOUNTER — Other Ambulatory Visit: Payer: Medicaid Other

## 2019-05-16 ENCOUNTER — Encounter: Payer: Medicaid Other | Admitting: Obstetrics and Gynecology

## 2019-05-21 ENCOUNTER — Encounter: Payer: Self-pay | Admitting: Surgical

## 2019-05-29 ENCOUNTER — Other Ambulatory Visit: Payer: Medicaid Other

## 2019-05-29 ENCOUNTER — Ambulatory Visit (INDEPENDENT_AMBULATORY_CARE_PROVIDER_SITE_OTHER): Payer: Medicaid Other | Admitting: Obstetrics and Gynecology

## 2019-05-29 ENCOUNTER — Other Ambulatory Visit: Payer: Self-pay

## 2019-05-29 ENCOUNTER — Encounter: Payer: Self-pay | Admitting: Obstetrics and Gynecology

## 2019-05-29 VITALS — BP 135/82 | HR 114 | Wt 324.8 lb

## 2019-05-29 DIAGNOSIS — Z6841 Body Mass Index (BMI) 40.0 and over, adult: Secondary | ICD-10-CM

## 2019-05-29 DIAGNOSIS — Z98891 History of uterine scar from previous surgery: Secondary | ICD-10-CM

## 2019-05-29 DIAGNOSIS — O0992 Supervision of high risk pregnancy, unspecified, second trimester: Secondary | ICD-10-CM

## 2019-05-29 LAB — POCT URINALYSIS DIPSTICK OB
Bilirubin, UA: NEGATIVE
Glucose, UA: NEGATIVE
Ketones, UA: NEGATIVE
Leukocytes, UA: NEGATIVE
Nitrite, UA: POSITIVE
Spec Grav, UA: 1.01 (ref 1.010–1.025)
Urobilinogen, UA: 0.2 E.U./dL
pH, UA: 7 (ref 5.0–8.0)

## 2019-05-29 MED ORDER — NITROFURANTOIN MONOHYD MACRO 100 MG PO CAPS: 100.0000 mg | ORAL_CAPSULE | Freq: Two times a day (BID) | ORAL | 0 refills | Status: DC

## 2019-05-29 NOTE — Progress Notes (Signed)
Patient comes in today for ROB visit. She has no concerns today.  

## 2019-05-29 NOTE — Progress Notes (Signed)
ROB: Patient has been noncompliant with visits and testing.  She says her "truck is fixed" and she will now be compliant.  She is doing her 1 hour GCT today.  Plan anesthesia consult in 2 weeks.  Will send to MFM for consult regarding possibility of placenta accreta with history of 4 prior cesarean deliveries.  Urine today consistent with UTI-Macrobid RX.

## 2019-05-30 LAB — CBC
Hematocrit: 30.9 % — ABNORMAL LOW (ref 34.0–46.6)
Hemoglobin: 9.8 g/dL — ABNORMAL LOW (ref 11.1–15.9)
MCH: 26.1 pg — ABNORMAL LOW (ref 26.6–33.0)
MCHC: 31.7 g/dL (ref 31.5–35.7)
MCV: 82 fL (ref 79–97)
Platelets: 310 10*3/uL (ref 150–450)
RBC: 3.76 x10E6/uL — ABNORMAL LOW (ref 3.77–5.28)
RDW: 15 % (ref 11.7–15.4)
WBC: 11.2 10*3/uL — ABNORMAL HIGH (ref 3.4–10.8)

## 2019-05-30 LAB — GLUCOSE, 1 HOUR GESTATIONAL: Gestational Diabetes Screen: 163 mg/dL — ABNORMAL HIGH (ref 65–139)

## 2019-05-30 LAB — RPR: RPR Ser Ql: NONREACTIVE

## 2019-06-04 ENCOUNTER — Other Ambulatory Visit: Payer: Self-pay | Admitting: Surgical

## 2019-06-04 DIAGNOSIS — O0992 Supervision of high risk pregnancy, unspecified, second trimester: Secondary | ICD-10-CM

## 2019-06-05 ENCOUNTER — Other Ambulatory Visit: Payer: Medicaid Other

## 2019-06-06 ENCOUNTER — Other Ambulatory Visit: Payer: Medicaid Other

## 2019-06-08 ENCOUNTER — Other Ambulatory Visit: Payer: Self-pay

## 2019-06-08 ENCOUNTER — Encounter
Admission: RE | Admit: 2019-06-08 | Discharge: 2019-06-08 | Disposition: A | Discharge status: Home / Self Care | Payer: Medicaid Other | Source: Ambulatory Visit | Attending: Anesthesiology | Admitting: Anesthesiology

## 2019-06-08 NOTE — Consult Note (Signed)
[facility] Anesthesia Consultation  [name] WER:[id number] DOB: [birth year] DOA: 06/08/2019 PCP: Patient, No Pcp Per   Requesting physician: Dr. Valentino Saxon Date of consultation: 06/08/19 Reason for consultation: Obesity during pregnancy  CHIEF COMPLAINT:  Obesity during pregnancy  HISTORY OF PRESENT ILLNESS: Sierra Lee  is a 29 y.o. female with a known history of obesity in pregnancy. Planning for repeat cesarean delivery (4 prior csections). Has developed gDM, diet controlled.   PAST MEDICAL HISTORY:   Past Medical History:  Diagnosis Date  . Anemia     PAST SURGICAL HISTORY:  Past Surgical History:  Procedure Laterality Date  . CESAREAN SECTION      SOCIAL HISTORY:  Social History   Tobacco Use  . Smoking status: Former Smoker    Types: Cigarettes    Quit date: 10/12/2018    Years since quitting: 0.6  . Smokeless tobacco: Never Used  Substance Use Topics  . Alcohol use: Not Currently    FAMILY HISTORY:  Family History  Problem Relation Age of Onset  . Diabetes Mother   . Breast cancer Maternal Aunt   . Breast cancer Maternal Grandmother     DRUG ALLERGIES:  Allergies  Allergen Reactions  . Other Anaphylaxis, Hives and Rash    Pineapples  . Penicillins Hives and Rash    REVIEW OF SYSTEMS:   RESPIRATORY: No cough, shortness of breath, wheezing.  CARDIOVASCULAR: No chest pain, orthopnea, edema.  HEMATOLOGY: No anemia, easy bruising or bleeding SKIN: No rash or lesion. NEUROLOGIC: No tingling, numbness, weakness.  PSYCHIATRY: No anxiety or depression.   MEDICATIONS AT HOME:  Prior to Admission medications   Medication Sig Start Date End Date Taking? Authorizing Provider  ferrous sulfate 325 (65 FE) MG tablet Take 325 mg by mouth daily with breakfast.    [provider]  nitrofurantoin, macrocrystal-monohydrate, (MACROBID) 100 MG capsule Take 1 capsule (100 mg total) by mouth 2 (two) times  daily. Patient not taking: Reported on 05/01/2019 02/28/19   Linzie Collin, MD  nitrofurantoin, macrocrystal-monohydrate, (MACROBID) 100 MG capsule Take 1 capsule (100 mg total) by mouth 2 (two) times daily. 05/29/19   Linzie Collin, MD  Prenatal Vit-Fe Fumarate-FA (PRENATAL MULTIVITAMIN) TABS tablet Take 1 tablet by mouth daily at 12 noon.    [provider]      PHYSICAL EXAMINATION:   VITAL SIGNS: Last menstrual period 10/23/2018.  GENERAL:  29 y.o.-year-old patient no acute distress.  HEENT: Head atraumatic, normocephalic. Oropharynx and nasopharynx clear. MP 2, TM distance >3 cm, normal mouth opening, grade 1 upper lip bite. LUNGS: Normal breath sounds bilaterally, no wheezing, rales,rhonchi. No use of accessory muscles of respiration.  CARDIOVASCULAR: S1, S2 normal. No murmurs, rubs, or gallops.  EXTREMITIES: No pedal edema, cyanosis, or clubbing.  NEUROLOGIC: normal gait PSYCHIATRIC: The patient is alert and oriented x 3.  SKIN: No obvious rash, lesion, or ulcer.    IMPRESSION AND PLAN:   Sierra Lee  is a 28 y.o. female presenting with obesity during pregnancy. BMI is currently 52 at [redacted] weeks gestation.   Airway exam reassuring today. Spinal interspaces minimally palpable.  Discussed plan for spinal anesthesia for repeat cesarean delivery. Discussed increased risk of difficult intubation during pregnancy should an emergency cesarean delivery be required.   Plan for delivery at [facility].

## 2019-06-11 NOTE — Patient Instructions (Addendum)
Third Trimester of Pregnancy  The third trimester is from week 28 through week 40 (months 7 through 9). This trimester is when your unborn baby (fetus) is growing very fast. At the end of the ninth month, the unborn baby is about 20 inches in length. It weighs about 6-10 pounds. Follow these instructions at home: Medicines  Take over-the-counter and prescription medicines only as told by your doctor. Some medicines are safe and some medicines are not safe during pregnancy.  Take a prenatal vitamin that contains at least 600 micrograms (mcg) of folic acid.  If you have trouble pooping (constipation), take medicine that will make your stool soft (stool softener) if your doctor approves. Eating and drinking   Eat regular, healthy meals.  Avoid raw meat and uncooked cheese.  If you get low calcium from the food you eat, talk to your doctor about taking a daily calcium supplement.  Eat four or five small meals rather than three large meals a day.  Avoid foods that are high in fat and sugars, such as fried and sweet foods.  To prevent constipation: ? Eat foods that are high in fiber, like fresh fruits and vegetables, whole grains, and beans. ? Drink enough fluids to keep your pee (urine) clear or pale yellow. Activity  Exercise only as told by your doctor. Stop exercising if you start to have cramps.  Avoid heavy lifting, wear low heels, and sit up straight.  Do not exercise if it is too hot, too humid, or if you are in a place of great height (high altitude).  You may continue to have sex unless your doctor tells you not to. Relieving pain and discomfort  Wear a good support bra if your breasts are tender.  Take frequent breaks and rest with your legs raised if you have leg cramps or low back pain.  Take warm water baths (sitz baths) to soothe pain or discomfort caused by hemorrhoids. Use hemorrhoid cream if your doctor approves.  If you develop puffy, bulging veins (varicose  veins) in your legs: ? Wear support hose or compression stockings as told by your doctor. ? Raise (elevate) your feet for 15 minutes, 3-4 times a day. ? Limit salt in your food. Safety  Wear your seat belt when driving.  Make a list of emergency phone numbers, including numbers for family, friends, the hospital, and police and fire departments. Preparing for your baby's arrival To prepare for the arrival of your baby:  Take prenatal classes.  Practice driving to the hospital.  Visit the hospital and tour the maternity area.  Talk to your work about taking leave once the baby comes.  Pack your hospital bag.  Prepare the baby's room.  Go to your doctor visits.  Buy a rear-facing car seat. Learn how to install it in your car. General instructions  Do not use hot tubs, steam rooms, or saunas.  Do not use any products that contain nicotine or tobacco, such as cigarettes and e-cigarettes. If you need help quitting, ask your doctor.  Do not drink alcohol.  Do not douche or use tampons or scented sanitary pads.  Do not cross your legs for long periods of time.  Do not travel for long distances unless you must. Only do so if your doctor says it is okay.  Visit your dentist if you have not gone during your pregnancy. Use a soft toothbrush to brush your teeth. Be gentle when you floss.  Avoid cat litter boxes and soil  used by cats. These carry germs that can cause birth defects in the baby and can cause a loss of your baby (miscarriage) or stillbirth.  Keep all your prenatal visits as told by your doctor. This is important. Contact a doctor if:  You are not sure if you are in labor or if your water has broken.  You are dizzy.  You have mild cramps or pressure in your lower belly.  You have a nagging pain in your belly area.  You continue to feel sick to your stomach, you throw up, or you have watery poop.  You have bad smelling fluid coming from your vagina.  You have  pain when you pee. Get help right away if:  You have a fever.  You are leaking fluid from your vagina.  You are spotting or bleeding from your vagina.  You have severe belly cramps or pain.  You lose or gain weight quickly.  You have trouble catching your breath and have chest pain.  You notice sudden or extreme puffiness (swelling) of your face, hands, ankles, feet, or legs.  You have not felt the baby move in over an hour.  You have severe headaches that do not go away with medicine.  You have trouble seeing.  You are leaking, or you are having a gush of fluid, from your vagina before you are 37 weeks.  You have regular belly spasms (contractions) before you are 37 weeks. Summary  The third trimester is from week 28 through week 40 (months 7 through 9). This time is when your unborn baby is growing very fast.  Follow your doctor's advice about medicine, food, and activity.  Get ready for the arrival of your baby by taking prenatal classes, getting all the baby items ready, preparing the baby's room, and visiting your doctor to be checked.  Get help right away if you are bleeding from your vagina, or you have chest pain and trouble catching your breath, or if you have not felt your baby move in over an hour. This information is not intended to replace advice given to you by your health care provider. Make sure you discuss any questions you have with your health care provider. Document Revised: 06/22/2018 Document Reviewed: 04/06/2016 Elsevier Patient Education  2020 [employer].   Breastfeeding Tips for a Good Latch Latching is how your baby's mouth attaches to your nipple to breastfeed. It is an important part of breastfeeding. Your baby may have trouble latching for a number of reasons. A poor latch may cause you to have cracked or sore nipples or other problems. Follow these instructions at home: How to position your baby  Find a comfortable place to sit or lie  down. Your neck and back should be well supported.  If you are seated, place a pillow or rolled-up blanket under your baby. This will bring him or her to the level of your breast.  Make sure that your baby's belly (abdomen) is facing your belly.  Try different positions to find one that works best for you and your baby. How to help your baby latch   To start, gently rub your breast. Move your fingertips in a circle as you massage from your chest wall toward your nipple. This helps milk flow. Keep doing this during feeding if needed.  Position your breast. Hold your breast with four fingers underneath and your thumb above your nipple. Keep your fingers away from your nipple and your baby's mouth. Follow these steps  to help your baby latch: 1. Rub your baby's lips gently with your finger or nipple. 2. When your baby's mouth is open wide enough, quickly bring your baby to your breast and place your whole nipple into your baby's mouth. Place as much of the colored area around your nipple (areola)as possible into your baby's mouth. 3. Your baby's tongue should be between his or her lower gum and your breast. 4. You should be able to see more areola above your baby's upper lip than below the lower lip. 5. When your baby starts sucking, you will feel a gentle pull on your nipple. You should not feel any pain. Be patient. It is common for a baby to suck for about 2-3 minutes to start the flow of breast milk. 6. Make sure that your baby's mouth is in the right position around your nipple. Your baby's lips should make a seal on your breast and be turned outward.  General instructions  Look for these signs that your baby has latched on to your nipple: ? The baby is quietly tugging or sucking without causing you pain. ? You hear the baby swallow after every 3 or 4 sucks. ? You see movement above and in front of the baby's ears while he or she is sucking.  Be aware of these signs that your baby has  not latched on to your nipple: ? The baby makes sucking sounds or smacking sounds while feeding. ? You have nipple pain.  If your baby is not latched well, put your little finger between your baby's gums and your nipple. This will break the seal. Then try to help your baby latch again.  If you keep having problems, get help from a breastfeeding specialist (Science writer). Contact a doctor if:  You have cracking or soreness in your nipples that lasts longer than 1 week.  You have nipple pain.  Your breasts are filled with too much milk (engorgement), and this does not improve after 48-72 hours.  You have a plugged milk duct and a fever.  You follow the tips for a good latch but you keep having problems or concerns.  You have a pus-like fluid coming from your breast.  Your baby is not gaining weight.  Your baby loses weight. Summary  Latching is how your baby's mouth attaches to your nipple to breastfeed.  Try different positions for breastfeeding to find one that works best for you and your baby.  A poor latch may cause you to have cracked or sore nipples or other problems. This information is not intended to replace advice given to you by your health care provider. Make sure you discuss any questions you have with your health care provider. Document Revised: 06/21/2018 Document Reviewed: 10/06/2016 Elsevier Patient Education  [place].   Nephrotic Syndrome Nephrotic syndrome is a set of findings that show there is a problem with the kidneys. These findings include:  High levels of protein in the urine (proteinuria).  High blood pressure (hypertension).  Low levels of the protein albumin in the blood (hypoalbuminemia).  High levels of cholesterol (hyperlipidemia) and triglycerides (hypertriglyceridemia) in the blood.  Swelling (edema) of the face, abdomen, arms, and legs. Nephrotic syndrome occurs when the kidneys' filters (glomeruli) are damaged.  Glomeruli remove toxins and waste products from the bloodstream. As a result of damaged glomeruli, essential products such as proteins may also be removed from the bloodstream. Nephrotic syndrome results from the loss of proteins and other substances that the  body needs. Nephrotic syndrome may increase your risk of further kidney damage and of health problems such as blood clots and infections. What are the causes? This condition may be caused by:  A kidney disease that damages the glomeruli, such as: ? Minimal change disease. ? Focal segmental glomerulosclerosis. ? Membranous nephropathy. ? Glomerulonephritis.  A condition or disease that affects other parts of the body (systemic), such as: ? Diabetes. ? Autoimmune diseases, such as lupus. ? Amyloidosis. ? Multiple myeloma. ? Some types of cancers. ? An infection, such as hepatitis C.  Certain medicines, such as: ? Nonsteroidal anti-inflammatory drugs (NSAIDs). ? Some anticancer drugs. In some cases, the cause may not be known. What are the signs or symptoms? Symptoms of this condition include:  Edema.  Foamy urine.  Unexplained weight gain.  Loss of appetite. In some cases, there are no noticeable symptoms. How is this diagnosed? This condition is usually diagnosed with a dipstick urine test followed by a 24-hour urine test in which you collect all of the urine that you produce over a 24-hour period. If your test shows that you have this condition, more tests may be needed to find the cause. These may include blood, urine, imaging, or kidney biopsy tests. How is this treated? Treatment for this condition may include medicines to control symptoms or to prevent complications from occurring. These medicines may:  Decrease inflammation in the kidneys.  Lower blood pressure.  Lower cholesterol.  Reduce the blood's ability to clot.  Help control edema. Further treatment will depend on the cause of the condition. Your health  care provider will discuss treatment options with you. Follow these instructions at home:  Follow diet instructions from your health care provider. This may include changes to help manage edema or hypertension, such as limiting your intake of salt or fluids.  Take over-the-counter and prescription medicines only as told by your health care provider. ? Do not take any new over-the-counter medicines or nutritional supplements unless approved by your health care provider. Many medicines can make this condition worse. Doses may need to be adjusted.  Keep all follow-up visits as told by your health care provider. This is important. Contact a health care provider if:  Your symptoms do not go away as expected or you develop new symptoms.  You continue to gain weight.  You have increased swelling of the feet, ankles, or legs. Get help right away if:  You stop producing urine.  You have prolonged bleeding.  You have shortness of breath.  You have a severe headache.  You have severe weakness. This information is not intended to replace advice given to you by your health care provider. Make sure you discuss any questions you have with your health care provider. Document Revised: 02/11/2017 Document Reviewed: 02/23/2016 Elsevier Patient Education  2020 [employer].

## 2019-06-12 ENCOUNTER — Encounter: Payer: Self-pay | Admitting: Obstetrics and Gynecology

## 2019-06-12 ENCOUNTER — Other Ambulatory Visit: Payer: Self-pay

## 2019-06-12 ENCOUNTER — Ambulatory Visit (INDEPENDENT_AMBULATORY_CARE_PROVIDER_SITE_OTHER): Payer: Medicaid Other | Admitting: Obstetrics and Gynecology

## 2019-06-12 VITALS — BP 122/79 | HR 101 | Wt 331.9 lb

## 2019-06-12 DIAGNOSIS — Z3A34 34 weeks gestation of pregnancy: Secondary | ICD-10-CM

## 2019-06-12 DIAGNOSIS — O99013 Anemia complicating pregnancy, third trimester: Secondary | ICD-10-CM | POA: Insufficient documentation

## 2019-06-12 DIAGNOSIS — R800 Isolated proteinuria: Secondary | ICD-10-CM

## 2019-06-12 DIAGNOSIS — O0993 Supervision of high risk pregnancy, unspecified, third trimester: Secondary | ICD-10-CM

## 2019-06-12 DIAGNOSIS — Z6841 Body Mass Index (BMI) 40.0 and over, adult: Secondary | ICD-10-CM

## 2019-06-12 DIAGNOSIS — Z98891 History of uterine scar from previous surgery: Secondary | ICD-10-CM

## 2019-06-12 LAB — POCT URINALYSIS DIPSTICK OB
Bilirubin, UA: NEGATIVE
Blood, UA: NEGATIVE
Glucose, UA: NEGATIVE
Ketones, UA: NEGATIVE
Leukocytes, UA: NEGATIVE
Nitrite, UA: NEGATIVE
Spec Grav, UA: >=1.03 — AB (ref 1.010–1.025)
Urobilinogen, UA: 0.2 E.U./dL
pH, UA: 6.5 (ref 5.0–8.0)

## 2019-06-12 NOTE — Progress Notes (Signed)
ROB-Pt present for routine prenatal care. Pt c/o vaginal pain and swollen feet. Ready set baby completed registation form given to pt.

## 2019-06-12 NOTE — Progress Notes (Signed)
ROB: Patient denies major complaints. Has had Anesthesia consultation. Ok to deliver at [facility]. She has not yet had her MFM consultation for placentation/growth due to h/o prior C-section x 4.  Discussed labs, patient is anemic, advised on daily daily iron supplement (previously prescribed, but patient has not started). Discussed importance correcting anemia prior to surgery.  Scheduled surgery for 07/16/19.  Significant proteinuria noted at past several visits. Review of BPs ok so far. Will still get PIH labs but patient likely with nephrotic syndrome. RTC in 2 weeks unless otherwise indicated.

## 2019-06-13 LAB — COMPREHENSIVE METABOLIC PANEL
ALT: 9 IU/L (ref 0–32)
AST: 14 IU/L (ref 0–40)
Albumin/Globulin Ratio: 1 — ABNORMAL LOW (ref 1.2–2.2)
Albumin: 3.3 g/dL — ABNORMAL LOW (ref 3.9–5.0)
Alkaline Phosphatase: 117 IU/L (ref 39–117)
BUN/Creatinine Ratio: 7 — ABNORMAL LOW (ref 9–23)
BUN: 4 mg/dL — ABNORMAL LOW (ref 6–20)
Bilirubin Total: 0.3 mg/dL (ref 0.0–1.2)
CO2: 19 mmol/L — ABNORMAL LOW (ref 20–29)
Calcium: 8.8 mg/dL (ref 8.7–10.2)
Chloride: 104 mmol/L (ref 96–106)
Creatinine, Ser: 0.59 mg/dL (ref 0.57–1.00)
GFR calc Af Amer: 146 mL/min/{1.73_m2} (ref >59)
GFR calc non Af Amer: 127 mL/min/{1.73_m2} (ref >59)
Globulin, Total: 3.2 g/dL (ref 1.5–4.5)
Glucose: 89 mg/dL (ref 65–99)
Potassium: 4.5 mmol/L (ref 3.5–5.2)
Sodium: 138 mmol/L (ref 134–144)
Total Protein: 6.5 g/dL (ref 6.0–8.5)

## 2019-06-13 LAB — PROTEIN / CREATININE RATIO, URINE
Creatinine, Urine: 318.8 mg/dL
Protein, Ur: 93 mg/dL
Protein/Creat Ratio: 292 mg/g creat — ABNORMAL HIGH (ref 0–200)

## 2019-06-20 ENCOUNTER — Other Ambulatory Visit: Payer: Self-pay | Admitting: Obstetrics and Gynecology

## 2019-06-20 DIAGNOSIS — Z98891 History of uterine scar from previous surgery: Secondary | ICD-10-CM

## 2019-06-21 ENCOUNTER — Encounter (HOSPITAL_COMMUNITY): Payer: Self-pay

## 2019-06-21 ENCOUNTER — Ambulatory Visit (HOSPITAL_COMMUNITY)
Admission: RE | Admit: 2019-06-21 | Discharge: 2019-06-21 | Disposition: A | Discharge status: Home / Self Care | Payer: Medicaid Other | Source: Ambulatory Visit | Attending: Obstetrics and Gynecology | Admitting: Obstetrics and Gynecology

## 2019-06-21 ENCOUNTER — Other Ambulatory Visit: Payer: Self-pay | Admitting: Obstetrics and Gynecology

## 2019-06-21 ENCOUNTER — Ambulatory Visit (HOSPITAL_COMMUNITY): Payer: Medicaid Other | Admitting: *Deleted

## 2019-06-21 ENCOUNTER — Other Ambulatory Visit: Payer: Self-pay

## 2019-06-21 ENCOUNTER — Ambulatory Visit (HOSPITAL_COMMUNITY): Payer: Medicaid Other

## 2019-06-21 VITALS — BP 134/66 | HR 97 | Temp 97.2°F

## 2019-06-21 DIAGNOSIS — O0930 Supervision of pregnancy with insufficient antenatal care, unspecified trimester: Secondary | ICD-10-CM

## 2019-06-21 DIAGNOSIS — O34219 Maternal care for unspecified type scar from previous cesarean delivery: Secondary | ICD-10-CM | POA: Diagnosis not present

## 2019-06-21 DIAGNOSIS — Z98891 History of uterine scar from previous surgery: Secondary | ICD-10-CM

## 2019-06-21 DIAGNOSIS — Z3A35 35 weeks gestation of pregnancy: Secondary | ICD-10-CM

## 2019-06-21 DIAGNOSIS — Z363 Encounter for antenatal screening for malformations: Secondary | ICD-10-CM

## 2019-06-21 DIAGNOSIS — O99213 Obesity complicating pregnancy, third trimester: Secondary | ICD-10-CM | POA: Diagnosis not present

## 2019-06-26 ENCOUNTER — Ambulatory Visit (INDEPENDENT_AMBULATORY_CARE_PROVIDER_SITE_OTHER): Payer: Medicaid Other | Admitting: Obstetrics and Gynecology

## 2019-06-26 ENCOUNTER — Other Ambulatory Visit: Payer: Self-pay

## 2019-06-26 VITALS — BP 96/64 | HR 97 | Wt 331.0 lb

## 2019-06-26 DIAGNOSIS — Z98891 History of uterine scar from previous surgery: Secondary | ICD-10-CM

## 2019-06-26 DIAGNOSIS — O0993 Supervision of high risk pregnancy, unspecified, third trimester: Secondary | ICD-10-CM

## 2019-06-26 DIAGNOSIS — Z3A36 36 weeks gestation of pregnancy: Secondary | ICD-10-CM | POA: Diagnosis not present

## 2019-06-26 LAB — POCT URINALYSIS DIPSTICK OB
Bilirubin, UA: NEGATIVE
Blood, UA: NEGATIVE
Glucose, UA: NEGATIVE
Ketones, UA: NEGATIVE
Leukocytes, UA: NEGATIVE
Nitrite, UA: POSITIVE
Spec Grav, UA: >=1.03 — AB (ref 1.010–1.025)
Urobilinogen, UA: 0.2 E.U./dL
pH, UA: 6 (ref 5.0–8.0)

## 2019-06-26 NOTE — Progress Notes (Signed)
ROB: Patient picked up her prescription for iron and has taken it a few times but not daily as directed.  I have reinforced the importance of taking iron for the next 3 weeks.  She did go to her MFM consultation.  Placenta not located near her old scar.  GBS-GC/CT performed today.  UPC repeated today.

## 2019-06-26 NOTE — Addendum Note (Signed)
Addended by: Dorian Pod on: 06/26/2019 01:24 PM   Modules accepted: Orders

## 2019-06-27 LAB — PROTEIN / CREATININE RATIO, URINE
Creatinine, Urine: 274.6 mg/dL
Protein, Ur: 165.9 mg/dL
Protein/Creat Ratio: 604 mg/g creat — ABNORMAL HIGH (ref 0–200)

## 2019-06-28 LAB — GC/CHLAMYDIA PROBE AMP
Chlamydia trachomatis, NAA: NEGATIVE
Neisseria Gonorrhoeae by PCR: NEGATIVE

## 2019-07-01 LAB — STREP GP B NAA+RFLX: Strep Gp B NAA+Rflx: POSITIVE — AB

## 2019-07-01 LAB — STREP GP B SUSCEPTIBILITY

## 2019-07-02 NOTE — Patient Instructions (Addendum)
    COVID 19 Instructions for Scheduled Procedure (Inductions/C-sections and GYN surgeries)   Thank you for choosing [facility] for your services.  You have been scheduled for a procedure called _____Repeat Cesaran Section with Tubal Ligation_____.    Your procedure is scheduled on ___________May 3, 2021________________.  You are required to have COVID-19 testing performed 2 days prior to your scheduled procedure date.  Testing is performed between 9 AM - 4 PM Monday through Friday.  Please present for testing on _____Friday, April 30, 2021___ during this hour. Drive up testing is performed in front of [facility] (this is next to the [employer]).    Upon your scheduled procedure date, you will need to arrive at the [facility] entrance. (There is a statue at the front of this entrance.)   Please arrive on time if you are scheduled for an induction of labor.   If you are scheduled for a Cesarean delivery or for Gyn Surgery, arrive 2 hours prior to your procedure time.   If you are an Obstetric patient and your arrival time falls between 11 PM and 6 AM call L&D [phone]) when you arrive.  A staff member will meet you at the [facility] entrance.  At this time, patients are allowed 1 support person to accompany them. Face masks are required for you and your support person. Your support person is now allowed to be there with you during the entire time of your admission.   Please contact the office if you have any questions regarding this information.  The [facility] office number is [phone].     Thank you,    Your [facility] Providers

## 2019-07-03 ENCOUNTER — Other Ambulatory Visit: Payer: Self-pay

## 2019-07-03 ENCOUNTER — Encounter: Payer: Self-pay | Admitting: Obstetrics and Gynecology

## 2019-07-03 ENCOUNTER — Ambulatory Visit (INDEPENDENT_AMBULATORY_CARE_PROVIDER_SITE_OTHER): Payer: Medicaid Other | Admitting: Obstetrics and Gynecology

## 2019-07-03 VITALS — BP 121/79 | HR 112 | Wt 332.0 lb

## 2019-07-03 DIAGNOSIS — O0993 Supervision of high risk pregnancy, unspecified, third trimester: Secondary | ICD-10-CM | POA: Diagnosis not present

## 2019-07-03 DIAGNOSIS — Z3A37 37 weeks gestation of pregnancy: Secondary | ICD-10-CM | POA: Diagnosis not present

## 2019-07-03 DIAGNOSIS — Z6841 Body Mass Index (BMI) 40.0 and over, adult: Secondary | ICD-10-CM

## 2019-07-03 DIAGNOSIS — O09891 Supervision of other high risk pregnancies, first trimester: Secondary | ICD-10-CM | POA: Insufficient documentation

## 2019-07-03 DIAGNOSIS — Z98891 History of uterine scar from previous surgery: Secondary | ICD-10-CM

## 2019-07-03 DIAGNOSIS — Z3483 Encounter for supervision of other normal pregnancy, third trimester: Secondary | ICD-10-CM

## 2019-07-03 DIAGNOSIS — O2600 Excessive weight gain in pregnancy, unspecified trimester: Secondary | ICD-10-CM | POA: Insufficient documentation

## 2019-07-03 DIAGNOSIS — O2603 Excessive weight gain in pregnancy, third trimester: Secondary | ICD-10-CM

## 2019-07-03 LAB — POCT URINALYSIS DIPSTICK OB
Bilirubin, UA: NEGATIVE
Blood, UA: NEGATIVE
Glucose, UA: NEGATIVE
Ketones, UA: NEGATIVE
Leukocytes, UA: NEGATIVE
Nitrite, UA: NEGATIVE
Spec Grav, UA: 1.02
Urobilinogen, UA: 0.2 U/dL
pH, UA: 6.5

## 2019-07-03 NOTE — Progress Notes (Signed)
ROB: Notes vaginal pressure but otherwise ok.  GBS positive. Discussed plans for C-section and BTL. Qestions answered. NST performed today was reviewed and was found to be reactive.  Continue recommended antenatal testing and prenatal care. RTC in 1 week.    NONSTRESS TEST INTERPRETATION  INDICATIONS: Obesity  FHR baseline: 140 bpm RESULTS:Reactive COMMENTS: No contractions   PLAN: 1. Continue fetal kick counts twice a day. 2. Continue antepartum testing as scheduled-weekly

## 2019-07-03 NOTE — Progress Notes (Signed)
ROB-Pt present for routine prenatal care. Pt c/o vaginal pressure no other issues.

## 2019-07-10 ENCOUNTER — Ambulatory Visit (INDEPENDENT_AMBULATORY_CARE_PROVIDER_SITE_OTHER): Payer: Medicaid Other | Admitting: Obstetrics and Gynecology

## 2019-07-10 ENCOUNTER — Other Ambulatory Visit: Payer: Self-pay

## 2019-07-10 ENCOUNTER — Encounter: Payer: Self-pay | Admitting: Obstetrics and Gynecology

## 2019-07-10 ENCOUNTER — Other Ambulatory Visit: Payer: Medicaid Other

## 2019-07-10 ENCOUNTER — Encounter
Admission: RE | Admit: 2019-07-10 | Discharge: 2019-07-10 | Disposition: A | Discharge status: Home / Self Care | Payer: Medicaid Other | Source: Ambulatory Visit | Attending: Obstetrics and Gynecology | Admitting: Obstetrics and Gynecology

## 2019-07-10 VITALS — BP 127/85 | HR 99 | Wt 335.7 lb

## 2019-07-10 DIAGNOSIS — Z3A38 38 weeks gestation of pregnancy: Secondary | ICD-10-CM

## 2019-07-10 DIAGNOSIS — O0993 Supervision of high risk pregnancy, unspecified, third trimester: Secondary | ICD-10-CM

## 2019-07-10 DIAGNOSIS — N3000 Acute cystitis without hematuria: Secondary | ICD-10-CM

## 2019-07-10 LAB — POCT URINALYSIS DIPSTICK OB
Bilirubin, UA: NEGATIVE
Blood, UA: NEGATIVE
Glucose, UA: NEGATIVE
Ketones, UA: NEGATIVE
Leukocytes, UA: NEGATIVE
Nitrite, UA: POSITIVE
Spec Grav, UA: 1.015 (ref 1.010–1.025)
Urobilinogen, UA: 0.2 E.U./dL
pH, UA: 6.5 (ref 5.0–8.0)

## 2019-07-10 MED ORDER — NITROFURANTOIN MONOHYD MACRO 100 MG PO CAPS: 100.0000 mg | ORAL_CAPSULE | Freq: Two times a day (BID) | ORAL | 1 refills | Status: DC

## 2019-07-10 NOTE — Patient Instructions (Addendum)
COVID TESTING Date: July 13, 2019 Testing site:  [facility] [address] Thru Hours:  9:67 am - 1:00 pm Once you are tested, you are asked to stay quarantined (avoiding public places) until after your surgery.   Your procedure is scheduled on: Jul 16, 2019 Report to Day Surgery on the 2nd floor of the [facility]. [facility] WILL CALL YOU ABOUT YOUR ARRIVAL TIME.  REMEMBER: Instructions that are not followed completely may result in serious medical risk, up to and including death; or upon the discretion of your surgeon and anesthesiologist your surgery may need to be rescheduled.  Do not eat food after midnight the night before surgery.  No gum chewing, lozengers or hard candies.  You may however, drink CLEAR liquids up to 2 hours before you are scheduled to arrive for your surgery. Do not drink anything within 2 hours of your scheduled arrival time.  Clear liquids include: - water  - apple juice without pulp - gatorade (not RED) - black coffee or tea (Do NOT add milk or creamers to the coffee or tea) Do NOT drink anything that is not on this list.  Type 1 and Type 2 diabetics should only drink water.  ENSURE PRE-SURGERY CARBOHYDRATE DRINK:  Complete drinking 2 hours prior to scheduled arrival time.  TAKE THESE MEDICATIONS THE MORNING OF SURGERY WITH A SIP OF WATER: NONE  Stop Anti-inflammatories (NSAIDS) such as Advil, Aleve, Ibuprofen, Motrin, Naproxen, Naprosyn and Aspirin based products such as Excedrin, Goodys Powder, BC Powder. (May take Tylenol or Acetaminophen if needed.)  Stop ANY OVER THE COUNTER supplements until after surgery. (May continue Vitamin D, Vitamin B, and multivitamin.)  No Alcohol for 24 hours before or after surgery.  No Smoking including e-cigarettes for 24 hours prior to surgery.  No chewable tobacco products for at least 6 hours prior to surgery.  No nicotine patches on the day of surgery.  Do not use any  "recreational" drugs for at least a week prior to your surgery.  Please be advised that the combination of cocaine and anesthesia may have negative outcomes, up to and including death. If you test positive for cocaine, your surgery will be cancelled.  On the morning of surgery brush your teeth with toothpaste and water, you may rinse your mouth with mouthwash if you wish. Do not swallow any toothpaste or mouthwash.  Do not wear jewelry, make-up, hairpins, clips or nail polish.  Do not wear lotions, powders, or perfumes.   Do not shave 48 hours prior to surgery.   Contact lenses, hearing aids and dentures may not be worn into surgery.  Do not bring valuables to the hospital. [facility] is not responsible for any missing/lost belongings or valuables.   Use CHG  wipes as directed on instruction sheet.  Notify your doctor if there is any change in your medical condition (cold, fever, infection).  Wear comfortable clothing (specific to your surgery type) to the hospital.  Plan for stool softeners for home use; pain medications have a tendency to cause constipation. You can also help prevent constipation by eating foods high in fiber such as fruits and vegetables and drinking plenty of fluids as your diet allows.  After surgery, you can help prevent lung complications by doing breathing exercises.  Take deep breaths and cough every 1-2 hours. Your doctor may order a device called an Incentive Spirometer to help you take deep breaths. When coughing or sneezing, hold a pillow firmly against your  incision with both hands. This is called "splinting." Doing this helps protect your incision. It also decreases belly discomfort.  YOU MAY BRING A SMALL SUITCASE WITH YOU TO HOSPITAL     Please call the Pre-admissions Testing Dept. at [phone] if you have any questions about these instructions.  Visitation Policy:  Patients undergoing a surgery or procedure may have one family member or  support person with them as long as that person is not COVID-19 positive or experiencing its symptoms.  That person may remain in the waiting area during the procedure.  Children under 21 years of age may have both parents or legal guardians with them during their hospital stay.   Inpatient Visitation Update:  Two designated support people may visit a patient during visiting hours 7 am to 8 pm. It must be the same two designated people for the duration of the patient stay. The visitors may come and go during the day, and there is no switching out to have different visitors. A mask must be worn at all times, including in the patient room.

## 2019-07-10 NOTE — Progress Notes (Signed)
ROB: Patient complains of frequent urination over the last 2 to 3 days.  Urine positive for nitrites.  Will treat with Macrobid.  Cesarean delivery scheduled for Monday.  Discussed with patient.  N.p.o. after midnight discussed.  Monitoring fetal movement discussed.  NST today reactive

## 2019-07-11 ENCOUNTER — Other Ambulatory Visit: Payer: Self-pay

## 2019-07-11 ENCOUNTER — Observation Stay
Admission: EM | Admit: 2019-07-11 | Discharge: 2019-07-11 | Disposition: A | Discharge status: Home / Self Care | Payer: Medicaid Other | Attending: Obstetrics and Gynecology | Admitting: Obstetrics and Gynecology

## 2019-07-11 ENCOUNTER — Encounter: Payer: Self-pay | Admitting: Obstetrics and Gynecology

## 2019-07-11 DIAGNOSIS — O4693 Antepartum hemorrhage, unspecified, third trimester: Secondary | ICD-10-CM | POA: Diagnosis not present

## 2019-07-11 DIAGNOSIS — Z3A38 38 weeks gestation of pregnancy: Secondary | ICD-10-CM | POA: Diagnosis not present

## 2019-07-11 DIAGNOSIS — Z20822 Contact with and (suspected) exposure to covid-19: Secondary | ICD-10-CM | POA: Diagnosis not present

## 2019-07-11 DIAGNOSIS — N939 Abnormal uterine and vaginal bleeding, unspecified: Secondary | ICD-10-CM

## 2019-07-11 DIAGNOSIS — O26893 Other specified pregnancy related conditions, third trimester: Principal | ICD-10-CM | POA: Insufficient documentation

## 2019-07-11 LAB — CBC
HCT: 33.1 % — ABNORMAL LOW (ref 36.0–46.0)
Hemoglobin: 10.7 g/dL — ABNORMAL LOW (ref 12.0–15.0)
MCH: 26 pg (ref 26.0–34.0)
MCHC: 32.3 g/dL (ref 30.0–36.0)
MCV: 80.3 fL (ref 80.0–100.0)
Platelets: 290 10*3/uL (ref 150–400)
RBC: 4.12 MIL/uL (ref 3.87–5.11)
RDW: 16.5 % — ABNORMAL HIGH (ref 11.5–15.5)
WBC: 11.3 10*3/uL — ABNORMAL HIGH (ref 4.0–10.5)
nRBC: 0 % (ref 0.0–0.2)

## 2019-07-11 LAB — RESPIRATORY PANEL BY RT PCR (FLU A&B, COVID)
Influenza A by PCR: NEGATIVE
Influenza B by PCR: NEGATIVE
SARS Coronavirus 2 by RT PCR: NEGATIVE

## 2019-07-11 LAB — ABO/RH: ABO/RH(D): O POS

## 2019-07-11 MED ORDER — LACTATED RINGERS IV SOLN: INTRAVENOUS | Status: DC

## 2019-07-11 NOTE — OB Triage Note (Signed)
Pt presents to the ED reporting vaginal bleeding. Pt is  [id number] [redacted]w[redacted]d. Pt is a prior c/s x4 and desires a repeat c/s. Pt reports vaginal bleeding since last night. Pt states, "I lost my mucus plug last night and then started to bleed." Pt states, "My drawers were full of blood this morning, when I went to get something to drink, I thought I was peeing myself but it was all blood.I have used a total of 3 pads since last night." Pt denies leaking of fluid and states positive fetal movement. Pt reports abdominal cramping rating 5/10 and describes it as light menstrual cramps. External monitors applied and assessing, Initial FHT 155 VSS.

## 2019-07-11 NOTE — Discharge Summary (Signed)
L&D OB Triage Note  SUBJECTIVE Sierra Lee is a 29 y.o. [id number] female at [redacted]w[redacted]d, EDD Estimated Date of Delivery: 07/22/19 who presented to triage with complaints of significant vaginal bleeding.  She brought the pads that she used for inspection at the time of presentation. (Noted to be moderate vaginal bleeding-each pad had blood down the middle but was not a soaked pad.) {Patient was followed and monitored for most of the day and during her stay she noted occasional abdominal cramping but slept on and off throughout the day despite occasional cramping on the monitor.  Patient is comfortable at the time of discharge. Throughout the day today her bleeding has significantly decreased and has not been bright red all day.  It is more of a brownish discharge only noted with urination..   OB History  Gravida Para Term Preterm AB Living  5 4 4  0 0 4  SAB TAB Ectopic Multiple Live Births  0 0 0 0 4    # Outcome Date GA Lbr Len/2nd Weight Sex Delivery Anes PTL Lv  5 Current           4 Term      CS-Unspec   LIV  3 Term      CS-Unspec   LIV  2 Term      CS-Unspec   LIV  1 Term      CS-Unspec   LIV    Medications Prior to Admission  Medication Sig Dispense Refill Last Dose  . ferrous sulfate 325 (65 FE) MG tablet Take 325 mg by mouth daily with breakfast.   07/10/2019 at Unknown time  . nitrofurantoin, macrocrystal-monohydrate, (MACROBID) 100 MG capsule Take 1 capsule (100 mg total) by mouth 2 (two) times daily. 14 capsule 1 07/10/2019 at Unknown time  . Prenatal Vit-Fe Fumarate-FA (PRENATAL MULTIVITAMIN) TABS tablet Take 1 tablet by mouth daily at 12 noon.   07/10/2019 at Unknown time     OBJECTIVE  Nursing Evaluation:   BP 124/87 (BP Location: Right Arm)   Pulse (!) 102   Temp 98.6 F (37 C) (Oral)   Resp 16   LMP 10/23/2018   SpO2 100%    Findings:        Patient had vaginal bleeding on admission which appeared as heavy brown discharge this discharge continued throughout the day  but has become significantly less since that time earlier this morning. Fetal heart rate was initially nonreactive but has become reactive and is very reassuring and has been so for several hours. Patient is noted to have contractions but there are between 8 and 15 minutes apart.  They are very mild to palpation and the patient can sleep through them.   Prior to discharge the patient's cervix was checked and found to be tightly closed.      NST was performed and has been reviewed by me.  NST INTERPRETATION: Category I  Mode: External Baseline Rate (A): 140 bpm Variability: Moderate Accelerations: 15 x 15 Decelerations: None     Contraction Frequency (min): 3-8  ASSESSMENT Impression:  1.  Pregnancy:  [id number] at [redacted]w[redacted]d , EDD Estimated Date of Delivery: 07/22/19 2.  Reassuring fetal and maternal status 3.  Patient stable with irregular contractions.  Not in labor. 4.  Vaginal bleeding resolved.  PLAN 1. Discussed current condition and above findings with patient and reassurance given.  All questions answered. 2. Discharge home with standard labor precautions given to return to L&D or call the office for problems.  3. Continue routine prenatal care. 4.  Have discussed return to labor delivery for resumption of vaginal bleeding or contractions that become more regular or more painful. 5.  Fetal kick counts discussed and the patient will do kick counts daily. 6.  Rationale for discharge versus 38-week elective cesarean delivery discussed in detail with the patient and she voiced her understanding.

## 2019-07-11 NOTE — Progress Notes (Signed)
Ch visited with Pt in response to OR for AD. Upon entering room, Pt put phone down and was wanting to talk. Pt said that she wanted to complete Living will. Ch gave AD education. Ch left AD packet with Pt. Ch will go back to try to complete AD later.

## 2019-07-12 ENCOUNTER — Other Ambulatory Visit: Payer: Medicaid Other

## 2019-07-12 LAB — RPR: RPR Ser Ql: NONREACTIVE

## 2019-07-13 ENCOUNTER — Other Ambulatory Visit
Admission: RE | Admit: 2019-07-13 | Discharge: 2019-07-13 | Disposition: A | Discharge status: Home / Self Care | Payer: Medicaid Other | Source: Ambulatory Visit | Attending: Obstetrics and Gynecology | Admitting: Obstetrics and Gynecology

## 2019-07-13 DIAGNOSIS — Z01812 Encounter for preprocedural laboratory examination: Secondary | ICD-10-CM | POA: Insufficient documentation

## 2019-07-13 LAB — TYPE AND SCREEN
ABO/RH(D): O POS
ABO/RH(D): O POS
Antibody Screen: NEGATIVE
Antibody Screen: NEGATIVE
Extend sample reason: UNDETERMINED

## 2019-07-13 LAB — CBC
HCT: 30.9 % — ABNORMAL LOW (ref 36.0–46.0)
Hemoglobin: 10 g/dL — ABNORMAL LOW (ref 12.0–15.0)
MCH: 26 pg (ref 26.0–34.0)
MCHC: 32.4 g/dL (ref 30.0–36.0)
MCV: 80.5 fL (ref 80.0–100.0)
Platelets: 294 10*3/uL (ref 150–400)
RBC: 3.84 MIL/uL — ABNORMAL LOW (ref 3.87–5.11)
RDW: 16.6 % — ABNORMAL HIGH (ref 11.5–15.5)
WBC: 10 10*3/uL (ref 4.0–10.5)
nRBC: 0 % (ref 0.0–0.2)

## 2019-07-14 LAB — RPR: RPR Ser Ql: NONREACTIVE

## 2019-07-15 MED ORDER — DEXTROSE 5 % IV SOLN
3.0000 g | INTRAVENOUS | Status: AC
  Administered 2019-07-16: 3 g via INTRAVENOUS
  Filled 2019-07-15: qty 3000
  Filled 2019-07-15: qty 3

## 2019-07-16 ENCOUNTER — Inpatient Hospital Stay
Admission: RE | Admit: 2019-07-16 | Discharge: 2019-07-18 | DRG: 784 | Disposition: A | Discharge status: Home / Self Care | Payer: Medicaid Other | Attending: Obstetrics and Gynecology | Admitting: Obstetrics and Gynecology

## 2019-07-16 ENCOUNTER — Other Ambulatory Visit: Payer: Self-pay

## 2019-07-16 ENCOUNTER — Inpatient Hospital Stay: Payer: Medicaid Other | Admitting: Anesthesiology

## 2019-07-16 ENCOUNTER — Encounter: Payer: Self-pay | Admitting: Obstetrics and Gynecology

## 2019-07-16 ENCOUNTER — Encounter
Admission: RE | Disposition: A | Discharge status: Home / Self Care | Payer: Self-pay | Source: Home / Self Care | Attending: Obstetrics and Gynecology

## 2019-07-16 DIAGNOSIS — O34219 Maternal care for unspecified type scar from previous cesarean delivery: Secondary | ICD-10-CM

## 2019-07-16 DIAGNOSIS — Z302 Encounter for sterilization: Secondary | ICD-10-CM

## 2019-07-16 DIAGNOSIS — O9902 Anemia complicating childbirth: Secondary | ICD-10-CM | POA: Diagnosis present

## 2019-07-16 DIAGNOSIS — O99214 Obesity complicating childbirth: Secondary | ICD-10-CM | POA: Diagnosis present

## 2019-07-16 DIAGNOSIS — Z87891 Personal history of nicotine dependence: Secondary | ICD-10-CM

## 2019-07-16 DIAGNOSIS — O0932 Supervision of pregnancy with insufficient antenatal care, second trimester: Secondary | ICD-10-CM

## 2019-07-16 DIAGNOSIS — O99213 Obesity complicating pregnancy, third trimester: Secondary | ICD-10-CM | POA: Diagnosis not present

## 2019-07-16 DIAGNOSIS — Z98891 History of uterine scar from previous surgery: Secondary | ICD-10-CM

## 2019-07-16 DIAGNOSIS — Z3A39 39 weeks gestation of pregnancy: Secondary | ICD-10-CM

## 2019-07-16 DIAGNOSIS — D62 Acute posthemorrhagic anemia: Secondary | ICD-10-CM | POA: Diagnosis not present

## 2019-07-16 DIAGNOSIS — O0933 Supervision of pregnancy with insufficient antenatal care, third trimester: Secondary | ICD-10-CM | POA: Diagnosis not present

## 2019-07-16 DIAGNOSIS — O34211 Maternal care for low transverse scar from previous cesarean delivery: Principal | ICD-10-CM | POA: Diagnosis present

## 2019-07-16 SURGERY — Surgical Case
Anesthesia: Spinal | Laterality: Bilateral

## 2019-07-16 MED ORDER — BUPIVACAINE IN DEXTROSE 0.75-8.25 % IT SOLN
INTRATHECAL | Status: DC | PRN
  Administered 2019-07-16: 1.6 mL via INTRATHECAL

## 2019-07-16 MED ORDER — NALBUPHINE HCL 10 MG/ML IJ SOLN: 5.0000 mg | INTRAMUSCULAR | Status: DC | PRN

## 2019-07-16 MED ORDER — ONDANSETRON HCL 4 MG/2ML IJ SOLN
INTRAMUSCULAR | Status: DC | PRN
  Administered 2019-07-16: 4 mg via INTRAVENOUS

## 2019-07-16 MED ORDER — ENOXAPARIN SODIUM 40 MG/0.4ML ~~LOC~~ SOLN
40.0000 mg | SUBCUTANEOUS | Status: AC
  Administered 2019-07-16: 40 mg via SUBCUTANEOUS
  Filled 2019-07-16: qty 0.4

## 2019-07-16 MED ORDER — FENTANYL CITRATE (PF) 100 MCG/2ML IJ SOLN
INTRAMUSCULAR | Status: AC
  Filled 2019-07-16: qty 2

## 2019-07-16 MED ORDER — NALBUPHINE HCL 10 MG/ML IJ SOLN: 5.0000 mg | Freq: Once | INTRAMUSCULAR | Status: DC | PRN

## 2019-07-16 MED ORDER — DIPHENHYDRAMINE HCL 50 MG/ML IJ SOLN: 12.5000 mg | INTRAMUSCULAR | Status: DC | PRN

## 2019-07-16 MED ORDER — NALOXONE HCL 4 MG/10ML IJ SOLN
1.0000 ug/kg/h | INTRAVENOUS | Status: DC | PRN
  Filled 2019-07-16: qty 5

## 2019-07-16 MED ORDER — OXYTOCIN 40 UNITS IN NORMAL SALINE INFUSION - SIMPLE MED
INTRAVENOUS | Status: AC
  Filled 2019-07-16: qty 1000

## 2019-07-16 MED ORDER — MISOPROSTOL 200 MCG PO TABS
ORAL_TABLET | ORAL | Status: AC
  Filled 2019-07-16: qty 5

## 2019-07-16 MED ORDER — MENTHOL 3 MG MT LOZG
1.0000 | LOZENGE | OROMUCOSAL | Status: DC | PRN
  Filled 2019-07-16: qty 9

## 2019-07-16 MED ORDER — ONDANSETRON HCL 4 MG/2ML IJ SOLN
INTRAMUSCULAR | Status: AC
  Filled 2019-07-16: qty 2

## 2019-07-16 MED ORDER — MORPHINE SULFATE (PF) 0.5 MG/ML IJ SOLN
INTRAMUSCULAR | Status: AC
  Filled 2019-07-16: qty 10

## 2019-07-16 MED ORDER — ACETAMINOPHEN 500 MG PO TABS
1000.0000 mg | ORAL_TABLET | Freq: Four times a day (QID) | ORAL | Status: AC
  Administered 2019-07-16 – 2019-07-17 (×3): 1000 mg via ORAL
  Filled 2019-07-16 (×3): qty 2

## 2019-07-16 MED ORDER — BUPIVACAINE HCL (PF) 0.5 % IJ SOLN
INTRAMUSCULAR | Status: AC
  Filled 2019-07-16: qty 30

## 2019-07-16 MED ORDER — ZOLPIDEM TARTRATE 5 MG PO TABS: 5.0000 mg | ORAL_TABLET | Freq: Every evening | ORAL | Status: DC | PRN

## 2019-07-16 MED ORDER — METHYLERGONOVINE MALEATE 0.2 MG/ML IJ SOLN
INTRAMUSCULAR | Status: AC
  Filled 2019-07-16: qty 1

## 2019-07-16 MED ORDER — ACETAMINOPHEN 500 MG PO TABS: 1000.0000 mg | ORAL_TABLET | Freq: Four times a day (QID) | ORAL | Status: DC

## 2019-07-16 MED ORDER — DEXAMETHASONE SODIUM PHOSPHATE 10 MG/ML IJ SOLN
INTRAMUSCULAR | Status: AC
  Filled 2019-07-16: qty 1

## 2019-07-16 MED ORDER — IBUPROFEN 800 MG PO TABS: 800.0000 mg | ORAL_TABLET | Freq: Four times a day (QID) | ORAL | Status: DC

## 2019-07-16 MED ORDER — KETOROLAC TROMETHAMINE 30 MG/ML IJ SOLN
30.0000 mg | Freq: Four times a day (QID) | INTRAMUSCULAR | Status: AC
  Administered 2019-07-16 – 2019-07-17 (×3): 30 mg via INTRAVENOUS
  Filled 2019-07-16 (×3): qty 1

## 2019-07-16 MED ORDER — MEPERIDINE HCL 25 MG/ML IJ SOLN: 6.2500 mg | INTRAMUSCULAR | Status: DC | PRN

## 2019-07-16 MED ORDER — GABAPENTIN 300 MG PO CAPS
300.0000 mg | ORAL_CAPSULE | ORAL | Status: AC
  Administered 2019-07-16: 300 mg via ORAL
  Filled 2019-07-16: qty 1

## 2019-07-16 MED ORDER — KETOROLAC TROMETHAMINE 30 MG/ML IJ SOLN
INTRAMUSCULAR | Status: AC
  Filled 2019-07-16: qty 1

## 2019-07-16 MED ORDER — OXYCODONE-ACETAMINOPHEN 5-325 MG PO TABS: 2.0000 | ORAL_TABLET | ORAL | Status: DC | PRN

## 2019-07-16 MED ORDER — DIPHENHYDRAMINE HCL 25 MG PO CAPS: 25.0000 mg | ORAL_CAPSULE | Freq: Four times a day (QID) | ORAL | Status: DC | PRN

## 2019-07-16 MED ORDER — EPHEDRINE 5 MG/ML INJ
INTRAVENOUS | Status: AC
  Filled 2019-07-16: qty 10

## 2019-07-16 MED ORDER — OXYTOCIN 40 UNITS IN NORMAL SALINE INFUSION - SIMPLE MED
INTRAVENOUS | Status: DC | PRN
  Administered 2019-07-16: 1000 mL via INTRAVENOUS

## 2019-07-16 MED ORDER — COCONUT OIL OIL: 1.0000 "application " | TOPICAL_OIL | Status: DC | PRN

## 2019-07-16 MED ORDER — LACTATED RINGERS IV SOLN: INTRAVENOUS | Status: DC

## 2019-07-16 MED ORDER — SODIUM CHLORIDE FLUSH 0.9 % IV SOLN
INTRAVENOUS | Status: DC | PRN
  Administered 2019-07-16: 11:00:00 100 mL

## 2019-07-16 MED ORDER — PHENYLEPHRINE HCL (PRESSORS) 10 MG/ML IV SOLN
INTRAVENOUS | Status: AC
  Filled 2019-07-16: qty 1

## 2019-07-16 MED ORDER — NALOXONE HCL 0.4 MG/ML IJ SOLN: 0.4000 mg | INTRAMUSCULAR | Status: DC | PRN

## 2019-07-16 MED ORDER — DIBUCAINE (PERIANAL) 1 % EX OINT: 1.0000 "application " | TOPICAL_OINTMENT | CUTANEOUS | Status: DC | PRN

## 2019-07-16 MED ORDER — BUPIVACAINE LIPOSOME 1.3 % IJ SUSP: Freq: Once | INTRAMUSCULAR | Status: DC

## 2019-07-16 MED ORDER — FENTANYL CITRATE (PF) 100 MCG/2ML IJ SOLN: INTRAMUSCULAR | Status: DC | PRN

## 2019-07-16 MED ORDER — ENOXAPARIN SODIUM 40 MG/0.4ML ~~LOC~~ SOLN
40.0000 mg | SUBCUTANEOUS | Status: DC
  Administered 2019-07-17: 40 mg via SUBCUTANEOUS
  Filled 2019-07-16: qty 0.4

## 2019-07-16 MED ORDER — SIMETHICONE 80 MG PO CHEW
80.0000 mg | CHEWABLE_TABLET | ORAL | Status: DC
  Administered 2019-07-16 – 2019-07-17 (×2): 80 mg via ORAL
  Filled 2019-07-16 (×3): qty 1

## 2019-07-16 MED ORDER — FERROUS SULFATE 325 (65 FE) MG PO TABS
325.0000 mg | ORAL_TABLET | Freq: Two times a day (BID) | ORAL | Status: DC
  Administered 2019-07-16 – 2019-07-18 (×4): 325 mg via ORAL
  Filled 2019-07-16 (×4): qty 1

## 2019-07-16 MED ORDER — MORPHINE SULFATE (PF) 0.5 MG/ML IJ SOLN
INTRAMUSCULAR | Status: DC | PRN
  Administered 2019-07-16: 150 ug via INTRATHECAL

## 2019-07-16 MED ORDER — ACETAMINOPHEN 500 MG PO TABS
1000.0000 mg | ORAL_TABLET | ORAL | Status: AC
  Administered 2019-07-16: 1000 mg via ORAL
  Filled 2019-07-16: qty 2

## 2019-07-16 MED ORDER — TRAMADOL HCL 50 MG PO TABS: 50.0000 mg | ORAL_TABLET | Freq: Four times a day (QID) | ORAL | Status: DC | PRN

## 2019-07-16 MED ORDER — PRENATAL MULTIVITAMIN CH
1.0000 | ORAL_TABLET | Freq: Every day | ORAL | Status: DC
  Administered 2019-07-17: 1 via ORAL
  Filled 2019-07-16: qty 1

## 2019-07-16 MED ORDER — FENTANYL CITRATE (PF) 100 MCG/2ML IJ SOLN: 25.0000 ug | INTRAMUSCULAR | Status: DC | PRN

## 2019-07-16 MED ORDER — SODIUM CHLORIDE (PF) 0.9 % IJ SOLN
INTRAMUSCULAR | Status: AC
  Filled 2019-07-16: qty 50

## 2019-07-16 MED ORDER — SIMETHICONE 80 MG PO CHEW
80.0000 mg | CHEWABLE_TABLET | ORAL | Status: DC | PRN
  Administered 2019-07-17: 80 mg via ORAL
  Filled 2019-07-16: qty 1

## 2019-07-16 MED ORDER — BUPIVACAINE LIPOSOME 1.3 % IJ SUSP
INTRAMUSCULAR | Status: AC
  Filled 2019-07-16: qty 20

## 2019-07-16 MED ORDER — FENTANYL CITRATE (PF) 100 MCG/2ML IJ SOLN
INTRAMUSCULAR | Status: DC | PRN
  Administered 2019-07-16: 15 ug via INTRATHECAL

## 2019-07-16 MED ORDER — DIPHENHYDRAMINE HCL 25 MG PO CAPS: 25.0000 mg | ORAL_CAPSULE | ORAL | Status: DC | PRN

## 2019-07-16 MED ORDER — OXYTOCIN 40 UNITS IN NORMAL SALINE INFUSION - SIMPLE MED
2.5000 [IU]/h | INTRAVENOUS | Status: AC
  Administered 2019-07-16: 2.5 [IU]/h via INTRAVENOUS

## 2019-07-16 MED ORDER — SENNOSIDES-DOCUSATE SODIUM 8.6-50 MG PO TABS
2.0000 | ORAL_TABLET | ORAL | Status: DC
  Administered 2019-07-17 – 2019-07-18 (×2): 2 via ORAL
  Filled 2019-07-16 (×2): qty 2

## 2019-07-16 MED ORDER — CARBOPROST TROMETHAMINE 250 MCG/ML IM SOLN
INTRAMUSCULAR | Status: AC
  Filled 2019-07-16: qty 1

## 2019-07-16 MED ORDER — KETOROLAC TROMETHAMINE 30 MG/ML IJ SOLN: 30.0000 mg | Freq: Once | INTRAMUSCULAR | Status: AC

## 2019-07-16 MED ORDER — OXYCODONE HCL 5 MG PO TABS
5.0000 mg | ORAL_TABLET | ORAL | Status: DC | PRN
  Administered 2019-07-17 – 2019-07-18 (×2): 5 mg via ORAL
  Filled 2019-07-16: qty 2
  Filled 2019-07-16: qty 1

## 2019-07-16 MED ORDER — DEXAMETHASONE SODIUM PHOSPHATE 10 MG/ML IJ SOLN
INTRAMUSCULAR | Status: DC | PRN
  Administered 2019-07-16 (×2): 5 mg via INTRAVENOUS

## 2019-07-16 MED ORDER — PHENYLEPHRINE HCL-NACL 10-0.9 MG/250ML-% IV SOLN
INTRAVENOUS | Status: DC | PRN
  Administered 2019-07-16: 50 ug/min via INTRAVENOUS

## 2019-07-16 MED ORDER — MAGNESIUM HYDROXIDE 400 MG/5ML PO SUSP: 30.0000 mL | ORAL | Status: DC | PRN

## 2019-07-16 MED ORDER — KETOROLAC TROMETHAMINE 30 MG/ML IJ SOLN
30.0000 mg | Freq: Four times a day (QID) | INTRAMUSCULAR | Status: DC | PRN
  Administered 2019-07-16: 30 mg via INTRAVENOUS

## 2019-07-16 MED ORDER — LACTATED RINGERS IV SOLN
INTRAVENOUS | Status: DC | PRN
Start: 2019-07-16 — End: 2019-07-16

## 2019-07-16 MED ORDER — SOD CITRATE-CITRIC ACID 500-334 MG/5ML PO SOLN
30.0000 mL | ORAL | Status: AC
  Administered 2019-07-16: 30 mL via ORAL
  Filled 2019-07-16: qty 30

## 2019-07-16 MED ORDER — KETOROLAC TROMETHAMINE 30 MG/ML IJ SOLN: 30.0000 mg | Freq: Four times a day (QID) | INTRAMUSCULAR | Status: DC

## 2019-07-16 MED ORDER — WITCH HAZEL-GLYCERIN EX PADS: 1.0000 "application " | MEDICATED_PAD | CUTANEOUS | Status: DC | PRN

## 2019-07-16 MED ORDER — HYDROMORPHONE HCL 1 MG/ML IJ SOLN: 1.0000 mg | INTRAMUSCULAR | Status: DC | PRN

## 2019-07-16 MED ORDER — GABAPENTIN 300 MG PO CAPS
300.0000 mg | ORAL_CAPSULE | Freq: Two times a day (BID) | ORAL | Status: DC
  Administered 2019-07-16 – 2019-07-18 (×4): 300 mg via ORAL
  Filled 2019-07-16 (×4): qty 1

## 2019-07-16 MED ORDER — SODIUM CHLORIDE 0.9% FLUSH: 3.0000 mL | INTRAVENOUS | Status: DC | PRN

## 2019-07-16 MED ORDER — KETOROLAC TROMETHAMINE 30 MG/ML IJ SOLN: 30.0000 mg | Freq: Four times a day (QID) | INTRAMUSCULAR | Status: DC | PRN

## 2019-07-16 MED ORDER — ONDANSETRON HCL 4 MG/2ML IJ SOLN: 4.0000 mg | Freq: Once | INTRAMUSCULAR | Status: DC | PRN

## 2019-07-16 SURGICAL SUPPLY — 32 items
BAG COUNTER SPONGE EZ (MISCELLANEOUS) ×2 IMPLANT
CANISTER SUCT 3000ML PPV (MISCELLANEOUS) ×3 IMPLANT
CHLORAPREP W/TINT 26 (MISCELLANEOUS) ×6 IMPLANT
CLOSURE STERI STRIP 1/2 X4 (GAUZE/BANDAGES/DRESSINGS) ×2 IMPLANT
COUNTER SPONGE BAG EZ (MISCELLANEOUS) ×1
COVER WAND RF STERILE (DRAPES) ×3 IMPLANT
DRSG OPSITE POSTOP 4X10 (GAUZE/BANDAGES/DRESSINGS) ×2 IMPLANT
DRSG TELFA 3X8 NADH (GAUZE/BANDAGES/DRESSINGS) ×3 IMPLANT
ELECT REM PT RETURN 9FT ADLT (ELECTROSURGICAL) ×3
ELECTRODE REM PT RTRN 9FT ADLT (ELECTROSURGICAL) ×1 IMPLANT
EXTRT SYSTEM ALEXIS 17CM (MISCELLANEOUS)
GAUZE SPONGE 4X4 12PLY STRL (GAUZE/BANDAGES/DRESSINGS) ×3 IMPLANT
GLOVE BIO SURGEON STRL SZ 6.5 (GLOVE) ×2 IMPLANT
GLOVE BIO SURGEONS STRL SZ 6.5 (GLOVE) ×1
GLOVE INDICATOR 7.0 STRL GRN (GLOVE) ×3 IMPLANT
GOWN STRL REUS W/ TWL LRG LVL3 (GOWN DISPOSABLE) ×2 IMPLANT
GOWN STRL REUS W/TWL LRG LVL3 (GOWN DISPOSABLE) ×4
KIT TURNOVER KIT A (KITS) ×3 IMPLANT
NS IRRIG 1000ML POUR BTL (IV SOLUTION) ×3 IMPLANT
PACK C SECTION (MISCELLANEOUS) ×3 IMPLANT
PAD DRESSING TELFA 3X8 NADH (GAUZE/BANDAGES/DRESSINGS) ×1 IMPLANT
PAD OB MATERNITY 4.3X12.25 (PERSONAL CARE ITEMS) ×3 IMPLANT
PAD PREP 24X41 OB/GYN DISP (PERSONAL CARE ITEMS) ×3 IMPLANT
PENCIL SMOKE ULTRAEVAC 22 CON (MISCELLANEOUS) ×3 IMPLANT
RTRCTR C-SECT PINK 25CM LRG (MISCELLANEOUS) ×2 IMPLANT
SUT CHROMIC 0 CT 1 (SUTURE) ×2 IMPLANT
SUT MNCRL AB 4-0 PS2 18 (SUTURE) ×3 IMPLANT
SUT PLAIN 2 0 XLH (SUTURE) IMPLANT
SUT VIC AB 0 CT1 36 (SUTURE) ×12 IMPLANT
SUT VIC AB 3-0 SH 27 (SUTURE) ×2
SUT VIC AB 3-0 SH 27X BRD (SUTURE) ×1 IMPLANT
SYSTEM CONTND EXTRCTN KII BLLN (MISCELLANEOUS) IMPLANT

## 2019-07-16 NOTE — Anesthesia Procedure Notes (Signed)
Spinal  Patient location during procedure: OR Start time: 07/16/2019 10:33 AM End time: 07/16/2019 10:38 AM Staffing Performed: resident/CRNA  Resident/CRNA: Justus Memory, CRNA Preanesthetic Checklist Completed: patient identified, IV checked, site marked, risks and benefits discussed, surgical consent, monitors and equipment checked, pre-op evaluation and timeout performed Spinal Block Patient position: sitting Prep: Betadine Patient monitoring: heart rate, continuous pulse ox, blood pressure and cardiac monitor Approach: midline Location: L4-5 Injection technique: single-shot Needle Needle type: Whitacre and Introducer  Needle gauge: 24 G Needle length: 9 cm Assessment Sensory level: T4 Additional Notes Negative paresthesia. Negative blood return. Positive free-flowing CSF. Expiration date of kit checked and confirmed. Patient tolerated procedure well, without complications.

## 2019-07-16 NOTE — H&P (Addendum)
Obstetric History and Physical  Sierra Lee is a 29 y.o. [id number] with IUP at [redacted]w[redacted]d presenting for scheduled repeat C-section with bilateral tubal ligation. Patient states she has been having  irregular, every 10 minutes contractions, none vaginal bleeding, intact membranes, with active fetal movement.    Prenatal Course Source of Care: [facility] with onset of care at 82 weeks.  Pregnancy complications or risks: Patient Active Problem List   Diagnosis Date Noted  . Late prenatal care affecting pregnancy in second trimester 07/16/2019  . Indication for care in labor or delivery 07/11/2019  . Excessive weight gain in pregnancy 07/03/2019  . Short interval between pregnancies affecting pregnancy in first trimester, antepartum 07/03/2019  . Anemia of pregnancy in third trimester 06/12/2019  . Insufficient prenatal care in second trimester 05/01/2019  . Morbid obesity with BMI of 50.0-59.9, adult ([place]) 05/01/2019  . Pregnancy, supervision, high-risk, third trimester 05/01/2019    She plans to breastfeed and bottle feed. She desires bilateral tubal ligation for postpartum contraception.    Prenatal labs and studies: ABO, Rh: --/--/O POS (04/30 0930) Antibody: NEG (04/30 0930) Rubella: 2.70 (12/08 1552) RPR: NON REACTIVE (04/30 0919)  HBsAg: Negative (12/08 1552)  HIV: Non Reactive (12/08 1552)  GBS:--/Positive (04/13 1157) 1 hr Glucola  Normal x 2. Genetic screening normal Anatomy US normal   OB History  Gravida Para Term Preterm AB Living  5 4 4     4   SAB TAB Ectopic Multiple Live Births          4    # Outcome Date GA Lbr Len/2nd Weight Sex Delivery Anes PTL Lv  5 Current           4 Term 08/15/18 [redacted]w[redacted]d  3881 g F CS-Unspec  N LIV  3 Term 08/21/13 [redacted]w[redacted]d  3289 g M CS-Unspec   LIV  2 Term 01/03/12 [redacted]w[redacted]d  3402 g M CS-Unspec  N LIV  1 Term 02/23/10 [redacted]w[redacted]d  3657 g F CS-Unspec   LIV     Complications: Pre-eclampsia     Past Medical History:  Diagnosis Date   . Anemia     Past Surgical History:  Procedure Laterality Date  . CESAREAN SECTION      Social History   Socioeconomic History  . Marital status: Single    Spouse name: Not on file  . Number of children: Not on file  . Years of education: Not on file  . Highest education level: Not on file  Occupational History  . Not on file  Tobacco Use  . Smoking status: Former Smoker    Types: Cigarettes    Quit date: 10/12/2018    Years since quitting: 0.7  . Smokeless tobacco: Never Used  Substance and Sexual Activity  . Alcohol use: Not Currently  . Drug use: Never  . Sexual activity: Not Currently    Partners: Male    Birth control/protection: Surgical  Other Topics Concern  . Not on file  Social History Narrative  . Not on file   Social Determinants of Health   Financial Resource Strain:   . Difficulty of Paying Living Expenses:   Food Insecurity:   . Worried About Charity fundraiser in the Last Year:   . Arboriculturist in the Last Year:   Transportation Needs:   . Film/video editor (Medical):   Marland Kitchen Lack of Transportation (Non-Medical):   Physical Activity:   . Days of Exercise per Week:   .  Minutes of Exercise per Session:   Stress:   . Feeling of Stress :   Social Connections:   . Frequency of Communication with Friends and Family:   . Frequency of Social Gatherings with Friends and Family:   . Attends Religious Services:   . Active Member of Clubs or Organizations:   . Attends Banker Meetings:   Marland Kitchen Marital Status:     Family History  Problem Relation Age of Onset  . Diabetes Mother   . Breast cancer Maternal Aunt   . Breast cancer Maternal Grandmother     Medications Prior to Admission  Medication Sig Dispense Refill Last Dose  . ferrous sulfate 325 (65 FE) MG tablet Take 325 mg by mouth daily with breakfast.   07/15/2019 at Unknown time  . nitrofurantoin, macrocrystal-monohydrate, (MACROBID) 100 MG capsule Take 1 capsule (100 mg total)  by mouth 2 (two) times daily. 14 capsule 1 07/15/2019 at Unknown time  . Prenatal Vit-Fe Fumarate-FA (PRENATAL MULTIVITAMIN) TABS tablet Take 1 tablet by mouth daily at 12 noon.   07/15/2019 at Unknown time    Allergies  Allergen Reactions  . Other Anaphylaxis, Hives and Rash    Pineapples  . Penicillins Hives and Rash    Review of Systems: Negative except for what is mentioned in HPI.  Physical Exam: BP (!) 134/59 (BP Location: Left Arm)   Pulse 90   Temp 98.6 F (37 C) (Oral)   Resp 20   Ht 5\' 6"  (1.676 m)   Wt 127 kg   LMP 10/23/2018   BMI 45.19 kg/m  CONSTITUTIONAL: Well-developed, well-nourished female in no acute distress.  HENT:  Normocephalic, atraumatic, External right and left ear normal. Oropharynx is clear and moist EYES: Conjunctivae and EOM are normal. Pupils are equal, round, and reactive to light. No scleral icterus.  NECK: Normal range of motion, supple, no masses SKIN: Skin is warm and dry. No rash noted. Not diaphoretic. No erythema. No pallor. NEUROLOGIC: Alert and oriented to person, place, and time. Normal reflexes, muscle tone coordination. No cranial nerve deficit noted. PSYCHIATRIC: Normal mood and affect. Normal behavior. Normal judgment and thought content. CARDIOVASCULAR: Normal heart rate noted, regular rhythm RESPIRATORY: Effort and breath sounds normal, no problems with respiration noted ABDOMEN: Soft, nontender, nondistended, gravid.  Well healed Pfannenstiel incision scars MUSCULOSKELETAL: Normal range of motion. No edema and no tenderness. 2+ distal pulses.  Cervical Exam: deferred Presentation: cephalic FHT:  Baseline rate 142 bpm   Variability moderate  Accelerations present   Decelerations none Contractions: Every 10 mins   Pertinent Labs/Studies:   Results for orders placed or performed during the hospital encounter of 07/13/19  CBC  Result Value Ref Range   WBC 10.0 4.0 - 10.5 K/uL   RBC 3.84 (L) 3.87 - 5.11 MIL/uL   Hemoglobin 10.0  (L) 12.0 - 15.0 g/dL   HCT 07/15/19 (L) 87.8 - 67.6 %   MCV 80.5 80.0 - 100.0 fL   MCH 26.0 26.0 - 34.0 pg   MCHC 32.4 30.0 - 36.0 g/dL   RDW 72.0 (H) 94.7 - 09.6 %   Platelets 294 150 - 400 K/uL   nRBC 0.0 0.0 - 0.2 %  RPR  Result Value Ref Range   RPR Ser Ql NON REACTIVE NON REACTIVE  Type and screen  Result Value Ref Range   ABO/RH(D) O POS    Antibody Screen NEG    Sample Expiration 07/16/2019,2359    Extend sample reason  PREGNANT WITHIN 3 MONTHS, UNABLE TO EXTEND Performed at [facility], [address], [place], [place] [address]     Assessment : Sierra Lee is a 29 y.o. [id number] at [redacted]w[redacted]d being admitted for scheduled repeat Cesarean section with bilateral tubal ligation.   Plan: The risks of cesarean section were discussed with the patient including but were not limited to: bleeding which may require transfusion or reoperation; infection which may require antibiotics; injury to bowel, bladder, ureters or other surrounding organs; injury to the fetus; need for additional procedures including hysterectomy in the event of a life-threatening hemorrhage; placental abnormalities wth subsequent pregnancies, incisional problems, thromboembolic phenomenon and other postoperative/anesthesia complications.  Patient also desires permanent sterilization.  Other reversible forms of contraception were discussed with patient; she declines all other modalities. Risks of procedure discussed with patient including but not limited to: risk of regret, permanence of method, bleeding, infection, injury to surrounding organs and need for additional procedures.  Failure risk of about 1% with increased risk of ectopic gestation if pregnancy occurs was also discussed with patient.  Also discussed possibility of post-tubal pain syndrome. The patient concurred with the proposed plan, giving informed written consent for the procedures.  Patient has been NPO since 10 pm last night, she will remain NPO  for procedure. Anesthesia and OR aware.  Preoperative prophylactic antibiotics and SCDs ordered on call to the OR.  To OR when ready.   Hildred Laser, MD [facility]

## 2019-07-16 NOTE — Progress Notes (Signed)
RN in room to give meds; pt asked "when will my milk come in?"; RN discussed with pt that right now there is colostrum in her breasts; for mature milk to be produced, the breasts need to be stimulated either by putting baby to the breast to breastfeed or using a breast pump; pt said "um, well I have a breast pump"; RN said "you would like to use the breast pump?"; pt said "yes"; this is pt's first time using breast pump (all other babies formula fed); RN set up the breast pump and showed the pt each step; RN turned breast pump on for pt; RN did encourage pt to use breast pump every 3-4 hours for stimulation for mature milk to be produced

## 2019-07-16 NOTE — Op Note (Addendum)
Cesarean Section Procedure Note  Indications: prior C-section x 4, morbid obesity in pregnancy  Pre-operative Diagnosis: 39 week 1 day pregnancy, morbid obesity (BMI 45), prior C-section x 4. Desires permanent sterilization.  Post-operative Diagnosis: Same  Surgeon: Hildred Laser, MD  Assistants: Brennan Bailey, MD; Aris Lot, [place] PA-S. An experienced assistant was required given the standard of surgical care given the complexity of the case.  This assistant was needed for exposure, dissection, suctioning, retraction, instrument exchange, and for overall help during the procedure.  Procedure: Repeat low transverse Cesarean Section with bilateral tubal ligation (Pomeroy method).   Anesthesia: Spinal anesthesia  Procedure Details: The patient was seen in the Holding Room. The risks, benefits, complications, treatment options, and expected outcomes were discussed with the patient.  The patient concurred with the proposed plan, giving informed consent.  The site of surgery properly noted/marked. The patient was taken to the Operating Room, identified as Sierra Lee and the procedure verified as C-Section Delivery. A Time Out was held and the above information confirmed.  After induction of anesthesia, the patient was draped and prepped in the usual sterile manner. Anesthesia was tested and noted to be adequate. A Pfannenstiel incision was made and carried down through the subcutaneous tissue to the fascia. Fascial incision was made and extended transversely. The fascia was separated from the underlying rectus tissue superiorly and inferiorly. The peritoneum was identified and entered. Peritoneal incision was extended longitudinally. Filmy adhesions of the peritoneum to the anterior surface of the uterus were encountered, which were lysed using the bovie. The surgical assist was able to provide retraction to allow for clear visualization of surgical site. No bladder flap was created due to inability  to identify vesicouterine reflection.  A low transverse uterine incision was made. Delivered from cephalic presentation was a 3540 gram Female with Apgar scores of 6 at one minute and 7 at five minutes and 8 at ten minutes.  The assistant was able to apply adequate fundal pressure to allow for successful delivery of the fetus. After the umbilical cord was clamped and cut cord blood was obtained for evaluation. The placenta was removed intact and appeared normal. The uterus was exteriorized and cleared of all clots and debris. The uterine outline, tubes and ovaries appeared normal.  The uterine incision was closed with running locked sutures of 0-Vicryl. Hemostasis was observed.   Attention was then turned to the fallopian tubes, and where the patient's right fallopian tube was identified and grasped with a Babcock clamp.  The tube was then followed out to the fimbria.  The Babcock clamp was then used to grasp the tube approximately 4 cm from the cornual region.  A 3 cm segment of tube was then ligated with a free tie of 0-Chromic using the Pomeroy method and excised.  The left fallopian tube was then ligated in a similar fashion and excised. The tubal lumens were cauterized bilaterally.  Good hemostasis was noted with bilateral fallopian tubes. The uterus was then returned to the abdomen.   The gutters were cleared of all clots and debris.   The fascia was injected with approximately 30 ml of 1.3% Exparel solution. The fascia was then reapproximated with a running suture of 0-Vicryl. The subcutaneous fat layer was reapproximated with 2-0 Vicryl. The subcutaneous fat layer was undermined from the skin and freed from dense adhesions causing buckling of the skin. The skin was reapproximated with 4-0 Monocryl, and injected with an additional 20 ml of 1.3% Exparel solution.  Instrument,  sponge, and needle counts were correct prior the abdominal closure and at the conclusion of the case.   Findings: Female infant,  cephalic presentation, 9449 grams, with Apgar scores of 6 at one minute and 7 at five minutes and 8 at ten minutes. Intact placenta with 3 vessel cord.  Clear amniotic fluid at amniotomy.  The uterine outline, tubes and ovaries appeared normal. There were filmy adhesions of the peritoneum to the uterus.   Estimated Blood Loss:  350 ml      Drains: foley catheter to gravity drainage, 100 ml of clear urine at end of the procedure         Total IV Fluids:  1600 ml  Specimens: Cord blood sent for evaluation         Implants: None         Complications:  None; patient tolerated the procedure well.         Disposition: PACU - hemodynamically stable.         Condition: stable    Rubie Maid, MD [facility]

## 2019-07-16 NOTE — Transfer of Care (Signed)
Immediate Anesthesia Transfer of Care Note  Patient: Sierra Lee  Procedure(s) Performed: CESAREAN SECTION WITH BILATERAL TUBAL LIGATION (Bilateral )  Patient Location: PACU  Anesthesia Type:Spinal  Level of Consciousness: awake, alert  and oriented  Airway & Oxygen Therapy: Patient Spontanous Breathing  Post-op Assessment: Report given to RN and Post -op Vital signs reviewed and stable  Post vital signs: Reviewed and stable  Last Vitals:  Vitals Value Taken Time  BP 113/69 07/16/19 1213  Temp    Pulse 74 07/16/19 1216  Resp 15 07/16/19 1216  SpO2 97 % 07/16/19 1216  Vitals shown include unvalidated device data.  Last Pain:  Vitals:   07/16/19 0925  TempSrc:   PainSc: 0-No pain         Complications: No apparent anesthesia complications

## 2019-07-16 NOTE — Progress Notes (Signed)
Notified Evans MD of bleeding on honeycomb dressing. Notified of presence at beginning of recovery and nothing more until my last two assessments.. Also notified MD of urine output and vaginal bleeding and QBL during recovery period.  MD states to take patient to mother baby and let them know about bleeding on honeycomb dressing and if it increases, have RN call Valentino Saxon MD and discuss plan.

## 2019-07-16 NOTE — Progress Notes (Signed)
Bedside report given to Harless Litten RN on mother baby unit. Reported off Evans MD order to call Valentino Saxon MD if bleeding on honeycomb gets larger than marked.

## 2019-07-16 NOTE — Anesthesia Preprocedure Evaluation (Signed)
Anesthesia Evaluation  Patient identified by MRN, date of birth, ID band Patient awake    Reviewed: Allergy & Precautions, NPO status , Patient's Chart, lab work & pertinent test results  History of Anesthesia Complications Negative for: history of anesthetic complications  Airway Mallampati: II       Dental   Pulmonary neg sleep apnea, neg COPD, Not current smoker, former smoker,           Cardiovascular (-) hypertension(-) Past MI and (-) CHF (-) dysrhythmias (-) Valvular Problems/Murmurs     Neuro/Psych neg Seizures    GI/Hepatic Neg liver ROS, GERD (with pregnancy)  ,  Endo/Other  neg diabetesMorbid obesity  Renal/GU negative Renal ROS     Musculoskeletal   Abdominal (+) + obese,   Peds  Hematology   Anesthesia Other Findings   Reproductive/Obstetrics                             Anesthesia Physical Anesthesia Plan  ASA: II  Anesthesia Plan: Spinal   Post-op Pain Management:    Induction:   PONV Risk Score and Plan:   Airway Management Planned:   Additional Equipment:   Intra-op Plan:   Post-operative Plan:   Informed Consent: I have reviewed the patients History and Physical, chart, labs and discussed the procedure including the risks, benefits and alternatives for the proposed anesthesia with the patient or authorized representative who has indicated his/her understanding and acceptance.       Plan Discussed with:   Anesthesia Plan Comments:         Anesthesia Quick Evaluation

## 2019-07-16 NOTE — Progress Notes (Signed)
   07/16/19 1100  Clinical Encounter Type  Visited With Family  Visit Type Follow-up  Referral From Nurse  Consult/Referral To Chaplain  Chaplain stopped in to see patient after receiving OR. When chaplain entered the room, chaplain met patient's mother, Katharine Look, who said patient was in delivery. Mother,who is nurse told chaplain that patient, Sierra Lee was having a c-section and this is a risky delivery. Katharine Look also said that she believed God. Before chaplain left Katharine Look said this is Reema's fifth child and she believes everything is going to be alright.

## 2019-07-17 LAB — CBC
HCT: 24.8 % — ABNORMAL LOW (ref 36.0–46.0)
Hemoglobin: 7.9 g/dL — ABNORMAL LOW (ref 12.0–15.0)
MCH: 26 pg (ref 26.0–34.0)
MCHC: 31.9 g/dL (ref 30.0–36.0)
MCV: 81.6 fL (ref 80.0–100.0)
Platelets: 264 10*3/uL (ref 150–400)
RBC: 3.04 MIL/uL — ABNORMAL LOW (ref 3.87–5.11)
RDW: 15.9 % — ABNORMAL HIGH (ref 11.5–15.5)
WBC: 12.6 10*3/uL — ABNORMAL HIGH (ref 4.0–10.5)
nRBC: 0 % (ref 0.0–0.2)

## 2019-07-17 LAB — CREATININE, SERUM
Creatinine, Ser: 0.71 mg/dL (ref 0.44–1.00)
GFR calc Af Amer: >60 mL/min (ref >60)
GFR calc non Af Amer: >60 mL/min (ref >60)

## 2019-07-17 MED ORDER — TRAMADOL HCL 50 MG PO TABS: 50.0000 mg | ORAL_TABLET | Freq: Four times a day (QID) | ORAL | 0 refills | Status: DC | PRN

## 2019-07-17 MED ORDER — FERROUS SULFATE 325 (65 FE) MG PO TABS: 325.0000 mg | ORAL_TABLET | Freq: Two times a day (BID) | ORAL | 2 refills | Status: DC

## 2019-07-17 MED ORDER — IBUPROFEN 800 MG PO TABS: 800.0000 mg | ORAL_TABLET | Freq: Three times a day (TID) | ORAL | 1 refills | Status: DC | PRN

## 2019-07-17 MED ORDER — IBUPROFEN 800 MG PO TABS
800.0000 mg | ORAL_TABLET | Freq: Four times a day (QID) | ORAL | Status: DC
  Administered 2019-07-17: 800 mg via ORAL
  Filled 2019-07-17 (×3): qty 1

## 2019-07-17 MED ORDER — IBUPROFEN 800 MG PO TABS
800.0000 mg | ORAL_TABLET | Freq: Four times a day (QID) | ORAL | Status: DC
  Administered 2019-07-17 – 2019-07-18 (×2): 800 mg via ORAL

## 2019-07-17 MED ORDER — ACETAMINOPHEN 500 MG PO TABS
1000.0000 mg | ORAL_TABLET | Freq: Four times a day (QID) | ORAL | Status: DC
  Administered 2019-07-17 – 2019-07-18 (×4): 1000 mg via ORAL
  Filled 2019-07-17 (×4): qty 2

## 2019-07-17 MED ORDER — DOCUSATE SODIUM 100 MG PO CAPS
100.0000 mg | ORAL_CAPSULE | Freq: Two times a day (BID) | ORAL | 2 refills | Status: DC | PRN
Start: 2019-07-17 — End: 2022-08-18

## 2019-07-17 NOTE — Lactation Note (Signed)
This note was copied from a baby's chart. Lactation Consultation Note  Patient Name: Boy Bryonna Sundby [id number] Date: 07/17/2019 Reason for consult: Initial assessment;1st time breastfeeding;Term   [employer] student in for initial visit. Mother voices that she is exhausted and was not able to sleep overnight due to baby "A'zer" being awake throughout the night. When asked what mother's desired feeding plan for going home, mother expresses her desire to formula feed and exclusively breast pump. States she has requested a breast pump through [facility] already.  DEBP already set-up at the bedside. Mother reports using pump one time overnight. Affirmed mother her efforts taken to achieve her goal. Discussed the importance of pumping 8-12x a day and also hand expressing/massaging her breasts with pumping sessions. Discussed the benefits of frequent stimulation for establishing her milk supply.  Mother reports being taught hand expression and paced bottle feeding already.  Showed mother lactation number on whiteboard, Encouraged her to call if questions arise. LC to follow-up this afternoon.   Maternal Data Formula Feeding for Exclusion: Yes Reason for exclusion: Mother's choice to formula and breast feed on admission Has patient been taught Hand Expression?: Yes Does the patient have breastfeeding experience prior to this delivery?: No  Feeding    LATCH Score                   Interventions Interventions: Breast feeding basics reviewed;Hand express;DEBP  Lactation Tools Discussed/Used WIC Program: Yes Pump Review: Setup, frequency, and cleaning   Consult Status Consult Status: Follow-up Date: 07/17/19 Follow-up type: In-patient    Corlis Hove -[facility] Student 07/17/2019, 9:35 AM

## 2019-07-17 NOTE — Lactation Note (Signed)
This note was copied from a baby's chart. Lactation Consultation Note  Patient Name: Sierra Lee [id number]'Y Date: 07/17/2019    [facility] student in for afternoon follow-up visit. Mother of patient reports attempting to pump once since initial visit this morning. States that there was no discomfort with pumping session and didn't see any visible colostrum. Reassured mother that this is normal and expected at this stage. Encouraged to keep pumping every 2-3 hours with hand expression.  Mother states bottle feeding is going well. Endorses practicing paced bottle feeding.  Denies any further questions or concerns at this time. Encouraged to call for assistance if needs arise.  Maternal Data    Feeding    LATCH Score                   Interventions    Lactation Tools Discussed/Used     Consult Status      Corlis Hove -[facility] student 07/17/2019, 3:55 PM

## 2019-07-17 NOTE — Anesthesia Postprocedure Evaluation (Signed)
Anesthesia Post Note  Patient: Sierra Lee  Procedure(s) Performed: CESAREAN SECTION WITH BILATERAL TUBAL LIGATION (Bilateral )  Patient location during evaluation: Mother Baby Anesthesia Type: Spinal Level of consciousness: awake, awake and alert, oriented and patient cooperative Pain management: pain level controlled Vital Signs Assessment: post-procedure vital signs reviewed and stable Respiratory status: spontaneous breathing, nonlabored ventilation and respiratory function stable Cardiovascular status: stable Postop Assessment: no headache, no backache, patient able to bend at knees, adequate PO intake, able to ambulate and no apparent nausea or vomiting Anesthetic complications: no     Last Vitals:  Vitals:   07/17/19 0700 07/17/19 0743  BP:  (!) 124/59  Pulse: 86 82  Resp:  18  Temp:  36.7 C  SpO2: 98% 100%    Last Pain:  Vitals:   07/17/19 0743  TempSrc: Oral  PainSc:                  Lyn Records

## 2019-07-17 NOTE — Discharge Summary (Signed)
OB Discharge Summary     Patient Name: Sierra Lee DOB: 11-25-1990 MRN: [id number]  Date of admission: 07/16/2019 Delivering MD: Rubie Maid   Date of discharge: 07/18/2019  Admitting diagnosis: S/P cesarean section [Z98.891] Intrauterine pregnancy: [redacted]w[redacted]d     Secondary diagnosis:  Active Problems:   Late prenatal care affecting pregnancy in second trimester   History of cesarean section x 4.    Anemia of pregnancy  Additional problems: Morbid obesity in pregnancy (BMI 51), undesired fertility     Discharge diagnosis: Term Pregnancy Delivered and Anemia                                                                                                Post partum procedures:postpartum tubal ligation (intra-operatively)   Augmentation: N/A  Complications: None  Hospital course:  Sceduled C/S   29 y.o. yo G5P5005 at [redacted]w[redacted]d was admitted to the hospital 07/16/2019 for scheduled cesarean section with the following indication:Elective Repeat.  Membrane Rupture Time/Date: 11:12 AM ,07/16/2019   Patient delivered a Viable infant.07/16/2019  Details of operation can be found in separate operative note.  Pateint had an uncomplicated postpartum course.  She is ambulating, tolerating a regular diet, passing flatus, and urinating well. Patient is discharged home in stable condition on  07/18/19         Physical exam  Vitals:   07/17/19 0900 07/17/19 1600 07/17/19 2338 07/18/19 0815  BP:  130/60 131/87 129/75  Pulse: 97 79 88 85  Resp:  18 20 20   Temp:  98 F (36.7 C) 98.6 F (37 C) 98 F (36.7 C)  TempSrc:  Oral Oral   SpO2: 98% 99% 100% 100%  Weight:      Height:       General: alert, cooperative and no distress Lochia: appropriate Uterine Fundus: firm Incision: Healing well with no significant drainage, No significant erythema, Dressing is clean, dry, and intact DVT Evaluation: No evidence of DVT seen on physical exam. Negative Homan's sign. No cords or calf tenderness. No  significant calf/ankle edema.   Labs: Lab Results  Component Value Date   WBC 12.6 (H) 07/17/2019   HGB 7.9 (L) 07/17/2019   HCT 24.8 (L) 07/17/2019   MCV 81.6 07/17/2019   PLT 264 07/17/2019    Discharge instruction: per After Visit Summary   After visit meds:  Allergies as of 07/18/2019      Reactions   Other Anaphylaxis, Hives, Rash   Pineapples   Penicillins Hives, Rash      Medication List    STOP taking these medications   nitrofurantoin (macrocrystal-monohydrate) 100 MG capsule Commonly known as: MACROBID     TAKE these medications   docusate sodium 100 MG capsule Commonly known as: COLACE Take 1 capsule (100 mg total) by mouth 2 (two) times daily as needed.   ferrous sulfate 325 (65 FE) MG tablet Take 1 tablet (325 mg total) by mouth 2 (two) times daily with a meal. What changed: when to take this   ibuprofen 800 MG tablet Commonly known as: ADVIL Take 1 tablet (800 mg total) by  mouth every 8 (eight) hours as needed.   prenatal multivitamin Tabs tablet Take 1 tablet by mouth daily at 12 noon.   traMADol 50 MG tablet Commonly known as: ULTRAM Take 1 tablet (50 mg total) by mouth every 6 (six) hours as needed for moderate pain (May take up to 2 tablets for severe pain).       Diet: routine diet  Activity: Advance as tolerated. Pelvic rest for 6 weeks.   Outpatient follow up:1 week for incision check Follow up Appt:No future appointments. Follow up Visit:No follow-ups on file.  Postpartum contraception: Tubal Ligation  Newborn Data: Live born female  Birth Weight: 7 lb 12.9 oz (3540 g) APGAR: 6, 7  Newborn Delivery   Birth date/time: 07/16/2019 11:12:00 Delivery type: C-Section, Low Transverse Trial of labor: No C-section categorization: Repeat      Baby Feeding: Bottle and Breast Disposition:home with mother   07/17/2019 Hildred Laser, MD  [facility]

## 2019-07-17 NOTE — Progress Notes (Signed)
Postpartum Day # 1: Cesarean Delivery  Subjective: Patient reports tolerating PO, + flatus and no problems voiding.  Denies nausea/vomiting.   Objective: Vital signs in last 24 hours: Temp:  [97.5 F (36.4 C)-98.6 F (37 C)] 98 F (36.7 C) (05/04 0743) Pulse Rate:  [61-106] 82 (05/04 0743) Resp:  [9-24] 18 (05/04 0743) BP: (97-141)/(59-87) 124/59 (05/04 0743) SpO2:  [90 %-100 %] 100 % (05/04 0743) Weight:  [287 kg] 127 kg (05/03 0924)  Physical Exam:  General: alert and no distress Lungs: clear to auscultation bilaterally Breasts: normal appearance, no masses or tenderness Heart: regular rate and rhythm, S1, S2 normal, no murmur, click, rub or gallop Abdomen: soft, non-tender; bowel sounds normal; no masses,  no organomegaly Pelvis: Lochia appropriate, Uterine Fundus firm, Incision: healing well, no significant drainage, no dehiscence, no significant erythema Extremities: DVT Evaluation: No evidence of DVT seen on physical exam. Negative Homan's sign. No cords or calf tenderness. Positive Homan's sign.   CBC Latest Ref Rng & Units 07/17/2019 07/13/2019  WBC 4.0 - 10.5 K/uL 12.6(H) 10.0  Hemoglobin 12.0 - 15.0 g/dL 7.9(L) 10.0(L)  Hematocrit 36.0 - 46.0 % 24.8(L) 30.9(L)  Platelets 150 - 400 K/uL 264 294    Assessment/Plan: Status post Cesarean section. Doing well postoperatively.  Formula and Breastfeeding, Lactation consult as needed.  Circumcision desired, to be performed outpatient.  Contraception: immediate postpartum BTL Encourage ambulation. Continue Lovenox for DVT prophylaxis.  Anemia of pregnancy, moderate postpartum. Asymptomatic. Will treat with PO iron outpatient.  Regular diet as tolerated Continue PO pain management Continue current care.     Hildred Laser , MD [facility]

## 2019-07-18 ENCOUNTER — Telehealth: Payer: Self-pay | Admitting: Obstetrics and Gynecology

## 2019-07-18 LAB — SURGICAL PATHOLOGY

## 2019-07-18 NOTE — Discharge Instructions (Signed)

## 2019-07-18 NOTE — Telephone Encounter (Signed)
Patient called stating she would like a script for Vicodin as she tolerated pain better. States tramadol is not giving her any relief . Pharmacy of preference is [facility] on [address].

## 2019-07-18 NOTE — Progress Notes (Signed)
Patient discharged home with infant. Discharge instructions and prescriptions given and reviewed with patient. Patient verbalized understanding. Escorted out by auxillary.  

## 2019-07-18 NOTE — Lactation Note (Signed)
This note was copied from a baby's chart. Lactation Consultation Note  Patient Name: Sierra Lee Date: 07/18/2019 Reason for consult: Follow-up assessment  LC followed-up with mom and baby before discharge. Mom had voiced desire to pump and provide breastmilk, she is continuing her efforts and plans to after discharge.  LC reiterated the importance of frequency and consistency to aid in bringing in an adequate milk supply for baby Sierra. Mom expressed understanding.   Also encouraged paced-bottle feeding, which mom reports she is well versed in, and prefers that method so baby has less spit-up.   Encouraged to call out today if she has any questions before discharge.  Maternal Data    Feeding    LATCH Score                   Interventions Interventions: Breast feeding basics reviewed;DEBP  Lactation Tools Discussed/Used     Consult Status Consult Status: Complete Date: 07/18/19 Follow-up type: Call as needed    Danford Bad 07/18/2019, 9:48 AM

## 2019-07-19 MED ORDER — OXYCODONE-ACETAMINOPHEN 5-325 MG PO TABS: 1.0000 | ORAL_TABLET | ORAL | 0 refills | Status: DC | PRN

## 2019-07-24 NOTE — Telephone Encounter (Signed)
Sent message to [facility] last week. Medication was sent to pt's pharmacy last week.

## 2019-07-25 ENCOUNTER — Telehealth: Payer: Self-pay

## 2019-07-25 ENCOUNTER — Ambulatory Visit: Payer: Medicaid Other | Admitting: Obstetrics and Gynecology

## 2019-07-25 NOTE — Telephone Encounter (Signed)
Pt called no answer VM not setup. Will send pt a mychart message.

## 2019-08-31 ENCOUNTER — Encounter: Payer: Medicaid Other | Admitting: Obstetrics and Gynecology

## 2019-09-03 ENCOUNTER — Encounter: Payer: Self-pay | Admitting: Obstetrics and Gynecology

## 2019-12-04 ENCOUNTER — Ambulatory Visit: Payer: Self-pay

## 2020-03-19 ENCOUNTER — Other Ambulatory Visit: Payer: Medicaid Other

## 2021-07-20 DIAGNOSIS — R519 Headache, unspecified: Secondary | ICD-10-CM | POA: Diagnosis not present

## 2021-07-20 DIAGNOSIS — S39012A Strain of muscle, fascia and tendon of lower back, initial encounter: Secondary | ICD-10-CM | POA: Diagnosis not present

## 2021-12-25 ENCOUNTER — Encounter: Payer: Self-pay | Admitting: Obstetrics and Gynecology

## 2021-12-29 ENCOUNTER — Encounter: Payer: Self-pay | Admitting: Obstetrics and Gynecology

## 2022-01-19 DIAGNOSIS — H5213 Myopia, bilateral: Secondary | ICD-10-CM | POA: Diagnosis not present

## 2022-08-17 NOTE — Progress Notes (Unsigned)
I,Vanessa  Vital,acting as a Neurosurgeon for [employer], PA-C.,have documented all relevant documentation on the behalf of Debera Lat, PA-C,as directed by  [employer], PA-C while in the presence of [employer], PA-C.   New patient visit   Patient: Sierra Lee   DOB: Apr 28, 1990   32 y.o. Female  MRN: [id number] Visit Date: 08/18/2022  Today's healthcare provider: Debera Lat, PA-C   Chief Complaint  Patient presents with   New Patient (Initial Visit)   Weight Loss   Subjective    [name] is a 32 y.o. female who presents today as a new patient to establish care.  HPI  Patient previously had [place] in [place] has not seen in a couple years but only has been seeing OBGYN. Patient states she would like to weight loss options and concern about her period. The patient, with a history of C-section and back pain, presents after a three year gap in primary care. She reports a significant weight gain over the past two years despite maintaining a healthy diet and walking for an hour daily. She denies any changes in appetite or diet, and has no known thyroid problems. She also reports heavy menstrual bleeding, requiring pad changes every hour for the first two days of her cycle, and every three hours on the third day. She reports that she is not able to sleep through the night due to the heavy bleeding. She also reports occasional blurry vision and a spot behind her right eye noted on a recent eye exam. She has a family history of diabetes and thyroid problems. Past Medical History:  Diagnosis Date   Allergy    Anemia    Past Surgical History:  Procedure Laterality Date   CESAREAN SECTION     CESAREAN SECTION WITH BILATERAL TUBAL LIGATION Bilateral 07/16/2019   Procedure: CESAREAN SECTION WITH BILATERAL TUBAL LIGATION;  Surgeon: Hildred Laser, MD;  Location: [facility] ORS;  Service: Obstetrics;  Laterality: Bilateral;  Repeat C-Section   Family Status  Relation Name Status   Mother   Alive   Mat Aunt  (Not Specified)   Emelda Brothers  (Not Specified)   MGM  (Not Specified)   Family History  Problem Relation Age of Onset   Diabetes Mother    Hypertension Mother    Breast cancer Maternal Aunt    Diabetes Paternal Aunt    Breast cancer Maternal Grandmother    Social History   Socioeconomic History   Marital status: Single    Spouse name: Not on file   Number of children: Not on file   Years of education: Not on file   Highest education level: Not on file  Occupational History   Not on file  Tobacco Use   Smoking status: Former    Types: Cigarettes    Quit date: 10/12/2018    Years since quitting: 3.8   Smokeless tobacco: Never  Vaping Use   Vaping Use: Never used  Substance and Sexual Activity   Alcohol use: Not Currently   Drug use: Never   Sexual activity: Not Currently    Partners: Male    Birth control/protection: Surgical  Other Topics Concern   Not on file  Social History Narrative   Not on file   Social Determinants of Health   Financial Resource Strain: Not on file  Food Insecurity: Not on file  Transportation Needs: Not on file  Physical Activity: Not on file  Stress: Not on file  Social Connections: Not on file  Outpatient Medications Prior to Visit  Medication Sig   docusate sodium (COLACE) 100 MG capsule Take 1 capsule (100 mg total) by mouth 2 (two) times daily as needed. (Patient not taking: Reported on 08/18/2022)   ferrous sulfate 325 (65 FE) MG tablet Take 1 tablet (325 mg total) by mouth 2 (two) times daily with a meal.   ibuprofen (ADVIL) 800 MG tablet Take 1 tablet (800 mg total) by mouth every 8 (eight) hours as needed. (Patient not taking: Reported on 08/18/2022)   oxyCODONE-acetaminophen (PERCOCET/ROXICET) 5-325 MG tablet Take 1-2 tablets by mouth every 4 (four) hours as needed for severe pain. (Patient not taking: Reported on 08/18/2022)   Prenatal Vit-Fe Fumarate-FA (PRENATAL MULTIVITAMIN) TABS tablet Take 1 tablet by mouth daily  at 12 noon. (Patient not taking: Reported on 08/18/2022)   No facility-administered medications prior to visit.   Allergies  Allergen Reactions   Other Anaphylaxis, Hives and Rash    Pineapples   Penicillins Hives and Rash    Immunization History  Administered Date(s) Administered   Tdap 05/01/2019    Health Maintenance  Topic Date Due   COVID-19 Vaccine (1) Never done   Hepatitis C Screening  Never done   PAP-Cervical Cytology Screening  Never done   PAP SMEAR-Modifier  Never done   INFLUENZA VACCINE  10/14/2022   DTaP/Tdap/Td (2 - Td or Tdap) 04/30/2029   HIV Screening  Completed   HPV VACCINES  Aged Out    Patient Care Team: Debera Lat, PA-C as PCP - General (Physician Assistant)  Review of Systems  All other systems reviewed and are negative. Except see HPI      Objective    BP 131/89 (BP Location: Right Arm, Patient Position: Sitting, Cuff Size: Large)   Pulse 84   Temp 98.9 F (37.2 C) (Oral)   Resp 14   Ht 5\' 5"  (1.651 m)   Wt (!) 332 lb 11.2 oz (150.9 kg)   LMP 08/05/2022 (Exact Date)   SpO2 100%   BMI 55.36 kg/m    Physical Exam Vitals reviewed.  Constitutional:      General: She is not in acute distress.    Appearance: Normal appearance. She is well-developed. She is obese. She is not diaphoretic.  HENT:     Head: Normocephalic and atraumatic.     Mouth/Throat:     Comments: Enlarged tonsils Eyes:     General: No scleral icterus.    Conjunctiva/sclera: Conjunctivae normal.  Neck:     Thyroid: No thyromegaly.  Cardiovascular:     Rate and Rhythm: Normal rate and regular rhythm.     Pulses: Normal pulses.     Heart sounds: Normal heart sounds. No murmur heard. Pulmonary:     Effort: Pulmonary effort is normal. No respiratory distress.     Breath sounds: Normal breath sounds. No wheezing, rhonchi or rales.  Musculoskeletal:     Cervical back: Neck supple.     Right lower leg: No edema.     Left lower leg: No edema.   Lymphadenopathy:     Cervical: Cervical adenopathy present.  Skin:    General: Skin is warm and dry.     Findings: No rash.  Neurological:     Mental Status: She is alert and oriented to person, place, and time. Mental status is at baseline.  Psychiatric:        Mood and Affect: Mood normal.        Behavior: Behavior normal.  Depression Screen     No data to display           No results found for any visits on 08/18/22.  Assessment & Plan     Chronic Back Pain: Reports of recurrent back pain, particularly during weather changes and potentially related to previous spinal anesthesia for C-section. -Consider referral to a specialist if pain persists or worsens.  Menorrhagia: Reports of heavy menstrual bleeding, requiring pad changes every hour for the first two days of menstruation. -Refer to OBGYN for further evaluation and potential imaging studies.  Snoring Enlarged Tonsils  Noted during physical examination, may be contributing to reported snoring. -Refer to ENT for further evaluation and potential tonsillectomy.  Obesity Family hx of Thyroid disease Family hx of DMII:  Chronic . Significant weight gain reported over the past two years despite reported diet and exercise efforts. Her current BMI 55.36 -Order comprehensive lab work to assess for potential underlying causes. - Lipid panel - CBC with Differential/Platelet - Comprehensive metabolic panel - Hemoglobin A1c - TSH Will FU  Eye Abnormality: Reports of a spot behind the right eye identified by an optometrist four months ago. -Request release of her future medical records for further review. She was schedule for her FU appt this month.  Establish care Welcomed to our clinic Reviewed past medical hx, social hx, family hx and surgical hx Pt advised to sign a release form for her old and future records Including her vaccination records She is due for her pap smear. General Health Maintenance: -Order  Hepatitis C screening as part of routine health maintenance. -Schedule follow-up appointment in 1-2 months to review lab results and discuss any further concerns.  History of anemia - CBC with Differential/Platelet - Ambulatory referral to Gynecology  Encounter for special screening examination for cardiovascular disorder - Lipid panel  Need for hepatitis C screening test Low risk screening - Hepatitis C antibody  Family history of diabetes mellitus - Hemoglobin A1c  Family history of thyroid disease - TSH   Return in about 6 weeks (around 09/29/2022) for CPE, chronic disease f/u.     The patient was advised to call back or seek an in-person evaluation if the symptoms worsen or if the condition fails to improve as anticipated.  I discussed the assessment and treatment plan with the patient. The patient was provided an opportunity to ask questions and all were answered. The patient agreed with the plan and demonstrated an understanding of the instructions.  I, Debera Lat, PA-C have reviewed all documentation for this visit. The documentation on  08/18/22 for the exam, diagnosis, procedures, and orders are all accurate and complete.  Debera Lat, [facility], MMS [facility] [phone] (phone) [phone] (fax)   [facility]

## 2022-08-18 ENCOUNTER — Ambulatory Visit (INDEPENDENT_AMBULATORY_CARE_PROVIDER_SITE_OTHER): Payer: Medicaid Other | Admitting: Physician Assistant

## 2022-08-18 ENCOUNTER — Encounter: Payer: Self-pay | Admitting: Physician Assistant

## 2022-08-18 VITALS — BP 131/89 | HR 84 | Temp 98.9°F | Resp 14 | Ht 65.0 in | Wt 332.7 lb

## 2022-08-18 DIAGNOSIS — Z833 Family history of diabetes mellitus: Secondary | ICD-10-CM | POA: Diagnosis not present

## 2022-08-18 DIAGNOSIS — Z862 Personal history of diseases of the blood and blood-forming organs and certain disorders involving the immune mechanism: Secondary | ICD-10-CM | POA: Diagnosis not present

## 2022-08-18 DIAGNOSIS — Z6841 Body Mass Index (BMI) 40.0 and over, adult: Secondary | ICD-10-CM | POA: Diagnosis not present

## 2022-08-18 DIAGNOSIS — M545 Low back pain, unspecified: Secondary | ICD-10-CM | POA: Diagnosis not present

## 2022-08-18 DIAGNOSIS — N921 Excessive and frequent menstruation with irregular cycle: Secondary | ICD-10-CM | POA: Diagnosis not present

## 2022-08-18 DIAGNOSIS — Z7689 Persons encountering health services in other specified circumstances: Secondary | ICD-10-CM

## 2022-08-18 DIAGNOSIS — Z8349 Family history of other endocrine, nutritional and metabolic diseases: Secondary | ICD-10-CM | POA: Diagnosis not present

## 2022-08-18 DIAGNOSIS — R0683 Snoring: Secondary | ICD-10-CM | POA: Insufficient documentation

## 2022-08-18 DIAGNOSIS — J351 Hypertrophy of tonsils: Secondary | ICD-10-CM | POA: Insufficient documentation

## 2022-08-18 DIAGNOSIS — Z1159 Encounter for screening for other viral diseases: Secondary | ICD-10-CM

## 2022-08-18 DIAGNOSIS — Z136 Encounter for screening for cardiovascular disorders: Secondary | ICD-10-CM

## 2022-08-18 DIAGNOSIS — O99013 Anemia complicating pregnancy, third trimester: Secondary | ICD-10-CM

## 2022-08-18 DIAGNOSIS — G8929 Other chronic pain: Secondary | ICD-10-CM | POA: Insufficient documentation

## 2022-08-19 ENCOUNTER — Other Ambulatory Visit: Payer: Self-pay

## 2022-08-19 ENCOUNTER — Encounter: Payer: Self-pay | Admitting: Physician Assistant

## 2022-08-19 ENCOUNTER — Telehealth: Payer: Self-pay | Admitting: Physician Assistant

## 2022-08-19 LAB — COMPREHENSIVE METABOLIC PANEL
ALT: 19 IU/L (ref 0–32)
AST: 17 IU/L (ref 0–40)
Albumin/Globulin Ratio: 1 — ABNORMAL LOW (ref 1.2–2.2)
Albumin: 4.1 g/dL (ref 4.0–5.0)
Alkaline Phosphatase: 89 IU/L (ref 44–121)
BUN/Creatinine Ratio: 7 — ABNORMAL LOW (ref 9–23)
BUN: 7 mg/dL (ref 6–20)
Bilirubin Total: 0.5 mg/dL (ref 0.0–1.2)
CO2: 22 mmol/L (ref 20–29)
Calcium: 9 mg/dL (ref 8.7–10.2)
Chloride: 103 mmol/L (ref 96–106)
Creatinine, Ser: 0.94 mg/dL (ref 0.57–1.00)
Globulin, Total: 4 g/dL (ref 1.5–4.5)
Glucose: 98 mg/dL (ref 70–99)
Potassium: 5.2 mmol/L (ref 3.5–5.2)
Sodium: 138 mmol/L (ref 134–144)
Total Protein: 8.1 g/dL (ref 6.0–8.5)
eGFR: 84 mL/min/{1.73_m2} (ref >59)

## 2022-08-19 LAB — CBC WITH DIFFERENTIAL/PLATELET
Basophils Absolute: 0.1 10*3/uL (ref 0.0–0.2)
Basos: 1 %
EOS (ABSOLUTE): 0.1 10*3/uL (ref 0.0–0.4)
Eos: 1 %
Hematocrit: 40.4 % (ref 34.0–46.6)
Hemoglobin: 12.8 g/dL (ref 11.1–15.9)
Immature Grans (Abs): 0 10*3/uL (ref 0.0–0.1)
Immature Granulocytes: 0 %
Lymphocytes Absolute: 2.9 10*3/uL (ref 0.7–3.1)
Lymphs: 32 %
MCH: 26.4 pg — ABNORMAL LOW (ref 26.6–33.0)
MCHC: 31.7 g/dL (ref 31.5–35.7)
MCV: 83 fL (ref 79–97)
Monocytes Absolute: 0.4 10*3/uL (ref 0.1–0.9)
Monocytes: 5 %
Neutrophils Absolute: 5.5 10*3/uL (ref 1.4–7.0)
Neutrophils: 61 %
Platelets: 338 10*3/uL (ref 150–450)
RBC: 4.85 x10E6/uL (ref 3.77–5.28)
RDW: 14.1 % (ref 11.7–15.4)
WBC: 8.9 10*3/uL (ref 3.4–10.8)

## 2022-08-19 LAB — LIPID PANEL
Chol/HDL Ratio: 3.5 ratio (ref 0.0–4.4)
Cholesterol, Total: 125 mg/dL (ref 100–199)
HDL: 36 mg/dL — ABNORMAL LOW (ref >39)
LDL Chol Calc (NIH): 66 mg/dL (ref 0–99)
Triglycerides: 131 mg/dL (ref 0–149)
VLDL Cholesterol Cal: 23 mg/dL (ref 5–40)

## 2022-08-19 LAB — HEMOGLOBIN A1C
Est. average glucose Bld gHb Est-mCnc: 123 mg/dL
Hgb A1c MFr Bld: 5.9 % — ABNORMAL HIGH (ref 4.8–5.6)

## 2022-08-19 LAB — HEPATITIS C ANTIBODY: Hep C Virus Ab: NONREACTIVE

## 2022-08-19 LAB — TSH: TSH: 0.878 u[IU]/mL (ref 0.450–4.500)

## 2022-08-19 MED ORDER — METFORMIN HCL 500 MG PO TABS: 500.0000 mg | ORAL_TABLET | Freq: Every day | ORAL | 0 refills | Status: DC

## 2022-08-19 NOTE — Progress Notes (Signed)
Please, let pt know that her lab results are WNL except Increased A1C /5.9 - in prediabetic range. Advised low carb diet and daily exercise. Considering her high BMI, recommended to start Metformin 500mg  daily and reassess in 3 mo during the FU appt. Decreased good cholesterol, advised weight loss via diet and daily exercise.

## 2022-08-19 NOTE — Telephone Encounter (Signed)
Pt. Given lab results and instructions. Please send Metformin to her pharmacy.

## 2022-08-20 ENCOUNTER — Other Ambulatory Visit: Payer: Self-pay | Admitting: Physician Assistant

## 2022-08-20 MED ORDER — WEGOVY 0.25 MG/0.5ML ~~LOC~~ SOAJ: 0.2500 mg | SUBCUTANEOUS | 2 refills | Status: DC

## 2022-08-30 ENCOUNTER — Telehealth: Payer: Self-pay | Admitting: Physician Assistant

## 2022-08-30 NOTE — Telephone Encounter (Signed)
Covermymeds is requesting prior authorization Key: [id number] Name: Setayesh Alcide PA that was started requires additional action

## 2022-09-07 ENCOUNTER — Ambulatory Visit: Payer: Medicaid Other | Admitting: Physician Assistant

## 2022-09-15 ENCOUNTER — Ambulatory Visit: Payer: Medicaid Other | Admitting: Obstetrics

## 2022-09-27 ENCOUNTER — Ambulatory Visit: Payer: Self-pay

## 2022-09-27 NOTE — Telephone Encounter (Signed)
The patient shares that they have recently been prescribed metFORMIN (GLUCOPHAGE) 500 MG tablet [[id number]] and are concerned with their increased use of the bathroom   The patient shares that they have experienced diarrhea since beginning the medication   The patient would like to speak with a member of clinical staff further when possible    Chief Complaint: Started having watery diarrhea after starting Metformin. Has several episodes every day after taking the medication. Symptoms: Above Frequency: 4 weeks Pertinent Negatives: Patient denies  Disposition: [] ED /[] Urgent Care (no appt availability in office) / [] Appointment(In office/virtual)/ []  [place] [facility]/ [] Home Care/ [] Refused Recommended Disposition /[] [place] [facility]/ [x]  Follow-up with PCP Additional Notes: Please advise pt.  Reason for Disposition  [1] Caller has URGENT medicine question about med that PCP or specialist prescribed AND [2] triager unable to answer question  Answer Assessment - Initial Assessment Questions 1. NAME of MEDICINE: "What medicine(s) are you calling about?"     Metformin 2. QUESTION: "What is your question?" (e.g., double dose of medicine, side effect)     Side effects 3. PRESCRIBER: "Who prescribed the medicine?" Reason: if prescribed by specialist, call should be referred to that group.     Ostwalt 4. SYMPTOMS: "Do you have any symptoms?" If Yes, ask: "What symptoms are you having?"  "How bad are the symptoms (e.g., mild, moderate, severe)     Watery diarrhea 5. PREGNANCY:  "Is there any chance that you are pregnant?" "When was your last menstrual period?"     No  Protocols used: Medication Question Call-A-AH

## 2022-09-29 NOTE — Telephone Encounter (Signed)
Called and spoke w/ pt advised pt that she is d/c metformin until her appt . In Oct., she can discuss discuss other medication at her appt. Pt stated that she is getting a new insurance starting in August. I told pt that she can take a picture of the insurance card upload to her my card acct. We can updated her info. From there. Pt stated that she understood that she will do so once she gets her card.

## 2022-10-13 ENCOUNTER — Encounter: Payer: Medicaid Other | Admitting: Family Medicine

## 2022-10-13 ENCOUNTER — Encounter: Payer: Self-pay | Admitting: Family Medicine

## 2022-10-13 NOTE — Progress Notes (Signed)
Patient did not keep appointment today. She may call to reschedule.  

## 2022-10-22 ENCOUNTER — Telehealth: Payer: Self-pay

## 2022-10-22 NOTE — Telephone Encounter (Signed)
Copied from CRM [id number]. Topic: General - Other >> Oct 22, 2022 12:02 PM Phill Myron wrote: Sierra Lee is returning [name]'s phone call and she would also like to discuss her new insurance th medication Wegovy.

## 2022-10-25 ENCOUNTER — Telehealth: Payer: Self-pay | Admitting: Physician Assistant

## 2022-10-25 NOTE — Telephone Encounter (Signed)
Received a fax from covermymeds for Wegovy 0.25mg /0.91ml  Key: [id number]

## 2022-10-26 NOTE — Telephone Encounter (Signed)
Pt called to check status, requesting a call back with an update sometime today. Please advise    Best contact: [phone]

## 2022-10-27 ENCOUNTER — Encounter: Payer: Self-pay | Admitting: Physician Assistant

## 2022-10-27 NOTE — Telephone Encounter (Signed)
Message from Plan  Your request has been denied

## 2022-10-27 NOTE — Telephone Encounter (Signed)
Patient has called back to get an update on prior auth for [facility], please advise.Per patient she has not heard anything and would like to be updated.     Best contact: [phone]

## 2022-11-04 ENCOUNTER — Telehealth: Payer: Self-pay | Admitting: Physician Assistant

## 2022-11-04 MED ORDER — SAXENDA 18 MG/3ML ~~LOC~~ SOPN: PEN_INJECTOR | SUBCUTANEOUS | 0 refills | Status: DC

## 2022-11-04 NOTE — Telephone Encounter (Signed)
Received a fax from covermymeds for Saxenda 18mg /5ml   Key: [id number]

## 2022-11-04 NOTE — Telephone Encounter (Signed)
PA initiated

## 2022-11-05 NOTE — Telephone Encounter (Signed)
Message from Plan Denied. Per the health plan preferred drug list, at least 1 preferred drugs must be tried before requesting this drug or tell us why the member cannot try any preferred alternatives. Please send [place] supporting chart notes and lab results. Here is list of preferred alternatives: Wegovy. Note: Some preferred drug(s) may have quantity limits. Refer to the health plan's preferred drug list for additional details. Additionally, per the health plan guideline, GLP1s for Weight Management , the clinical criteria below must be met before this request can be approved. Please send [place] supporting chart notes and lab results. The preferred drug, if applicable, which treats the PA indication, is required unless the patient meets the non-preferred PDL PA criteria, including completion of an adequate titration period of 3 to 6 months of the preferred drug. (Failure of the preferred drug is considered to be a drug trial and failure of 3 to 6 months to complete dose titration and determine the side effect profile for the member, unless there is a documented contraindication to the preferred drug. Titration can take up to 6 months for GLP1s.)   FYI- on the [facility] under the media tab is a letter of explanation why it was denied.

## 2022-11-10 ENCOUNTER — Encounter: Payer: Self-pay | Admitting: Physician Assistant

## 2022-11-17 ENCOUNTER — Encounter: Payer: Medicaid Other | Admitting: Family Medicine

## 2022-11-29 ENCOUNTER — Encounter: Payer: Medicaid Other | Admitting: Family Medicine

## 2022-12-20 NOTE — Progress Notes (Unsigned)
Patient was not seen for appt d/t no call, no show, or late arrival >10 mins past appt time.    Debera Lat PA [facility] [address] [place], [place] [address] [phone] (phone) [phone] (fax) [facility]  Thought Content: Thought content normal.        Judgment: Judgment normal.      No results found for any visits on 12/21/22.  Assessment & Plan    Routine Health Maintenance and Physical Exam  Exercise Activities and Dietary recommendations  Goals   None     Immunization History  Administered Date(s) Administered   Tdap 05/01/2019    Health Maintenance  Topic Date Due   COVID-19 Vaccine (1) Never done   Pap Smear  02/07/2021   Flu Shot  Never done   DTaP/Tdap/Td vaccine (2 - Td or Tdap) 04/30/2029   Hepatitis C Screening  Completed   HIV Screening  Completed   HPV Vaccine  Aged Out    Discussed health benefits of physical activity, and encouraged her to engage in regular exercise appropriate for her age and condition.   No follow-ups on file.    The patient was advised to call back or seek an in-person evaluation if the symptoms worsen or if the condition fails to improve as anticipated.  I discussed the assessment and treatment plan with the patient. The patient was provided an opportunity to ask questions and all were answered. The patient agreed with the plan and demonstrated an understanding of the instructions.  I, Debera Lat, PA-C have reviewed all documentation for this visit. The documentation on  12/21/22 for the exam, diagnosis, procedures, and orders are all accurate and complete.  Debera Lat, [facility], MMS [facility] [phone] (phone) [phone] (fax)  [facility]

## 2022-12-21 ENCOUNTER — Ambulatory Visit (INDEPENDENT_AMBULATORY_CARE_PROVIDER_SITE_OTHER): Payer: Medicaid Other | Admitting: Physician Assistant

## 2022-12-21 DIAGNOSIS — Z91199 Patient's noncompliance with other medical treatment and regimen due to unspecified reason: Secondary | ICD-10-CM

## 2022-12-21 DIAGNOSIS — Z0000 Encounter for general adult medical examination without abnormal findings: Secondary | ICD-10-CM

## 2023-01-25 ENCOUNTER — Ambulatory Visit: Payer: Self-pay

## 2023-01-25 NOTE — Telephone Encounter (Addendum)
Message from Sierra Lee sent at 01/25/2023  2:29 PM EST  Summary: Swelling in feet   Swelling in feet, seeking alternative medication for pre-diabetes. Says it is making her use the restroom so much that she cannot leave the house  Best contact: [phone]         Chief Complaint: feet and ankle swelling  Symptoms: swelling that will not go away. Asking for Lasix, voiding up to 5 times every night Frequency: 4 weeks  Pertinent Negatives: Patient denies pain, SOB, CP, HA Disposition: [] ED /[] Urgent Care (no appt availability in office) / [x] Appointment(In office/virtual)/ []  [place] [facility]/ [] Home Care/ [] Refused Recommended Disposition /[] [place] [facility]/ []  Follow-up with PCP Additional Notes: changed appt to tomorrow.  Reason for Disposition  [1] MILD swelling of both ankles (i.Lee., pedal edema) AND [2] new-onset or worsening  Answer Assessment - Initial Assessment Questions 1. ONSET: "When did the swelling start?" (Lee.g., minutes, hours, days)     4 weeks ago  2. LOCATION: "What part of the leg is swollen?"  "Are both legs swollen or just one leg?"     Ankle down to feet  3. SEVERITY: "How bad is the swelling?" (Lee.g., localized; mild, moderate, severe)   - Localized: Small area of swelling localized to one leg.   - MILD pedal edema: Swelling limited to foot and ankle, pitting edema < 1/4 inch (6 mm) deep, rest and elevation eliminate most or all swelling.   - MODERATE edema: Swelling of lower leg to knee, pitting edema > 1/4 inch (6 mm) deep, rest and elevation only partially reduce swelling.   - SEVERE edema: Swelling extends above knee, facial or hand swelling present.      no 4. REDNESS: "Does the swelling look red or infected?"     no 5. PAIN: "Is the swelling painful to touch?" If Yes, ask: "How painful is it?"   (Scale 1-10; mild, moderate or severe)     no 6. FEVER: "Do you have a fever?" If Yes, ask: "What is it, how was it measured, and when did it  start?"      no 7. CAUSE: "What do you think is causing the leg swelling?"     unsure  9. RECURRENT SYMPTOM: "Have you had leg swelling before?" If Yes, ask: "When was the last time?" "What happened that time?"     no 10. OTHER SYMPTOMS: "Do you have any other symptoms?" (Lee.g., chest pain, difficulty breathing)       no  Protocols used: Leg Swelling and Edema-A-AH

## 2023-01-25 NOTE — Progress Notes (Unsigned)
      Established patient visit   Patient: Sierra Lee   DOB: 02-17-1991   32 y.o. Female  MRN: [id number] Visit Date: 01/26/2023  Today's healthcare provider: Ronnald Ramp, MD   No chief complaint on file.  Subjective       Discussed the use of AI scribe software for clinical note transcription with the patient, who gave verbal consent to proceed.  History of Present Illness             Past Medical History:  Diagnosis Date   Allergy    Anemia     Medications: Outpatient Medications Prior to Visit  Medication Sig   Liraglutide -Weight Management (SAXENDA) 18 MG/3ML SOPN Initial, 0.6 mg subQ once daily for 1 week; increase weekly in increments of 0.6 mg/day until maintenance dosage of 3 mg once daily is reached.   metFORMIN (GLUCOPHAGE) 500 MG tablet Take 1 tablet (500 mg total) by mouth daily with breakfast.   No facility-administered medications prior to visit.    Review of Systems  {Insert previous labs (optional):23779} {See past labs  Heme  Chem  Endocrine  Serology  Results Review (optional):1}   Objective    There were no vitals taken for this visit. {Insert last BP/Wt (optional):23777}{See vitals history (optional):1}      Physical Exam  ***  No results found for any visits on 01/26/23.  Assessment & Plan     Problem List Items Addressed This Visit   None   Assessment and Plan              No follow-ups on file.         Ronnald Ramp, MD  [facility] [phone] (phone) [phone] (fax)  [facility]

## 2023-01-26 ENCOUNTER — Other Ambulatory Visit: Payer: Self-pay | Admitting: Physician Assistant

## 2023-01-26 ENCOUNTER — Encounter: Payer: Self-pay | Admitting: Family Medicine

## 2023-01-26 ENCOUNTER — Ambulatory Visit: Payer: Medicaid Other | Admitting: Family Medicine

## 2023-01-26 VITALS — BP 180/132 | HR 80 | Ht 65.0 in | Wt 354.0 lb

## 2023-01-26 DIAGNOSIS — M25472 Effusion, left ankle: Secondary | ICD-10-CM | POA: Diagnosis not present

## 2023-01-26 DIAGNOSIS — Z6841 Body Mass Index (BMI) 40.0 and over, adult: Secondary | ICD-10-CM

## 2023-01-26 DIAGNOSIS — M25475 Effusion, left foot: Secondary | ICD-10-CM | POA: Diagnosis not present

## 2023-01-26 DIAGNOSIS — R7303 Prediabetes: Secondary | ICD-10-CM

## 2023-01-26 DIAGNOSIS — M25474 Effusion, right foot: Secondary | ICD-10-CM | POA: Insufficient documentation

## 2023-01-26 DIAGNOSIS — Z8759 Personal history of other complications of pregnancy, childbirth and the puerperium: Secondary | ICD-10-CM | POA: Diagnosis not present

## 2023-01-26 DIAGNOSIS — M25471 Effusion, right ankle: Secondary | ICD-10-CM | POA: Insufficient documentation

## 2023-01-26 DIAGNOSIS — I10 Essential (primary) hypertension: Secondary | ICD-10-CM | POA: Insufficient documentation

## 2023-01-26 LAB — POCT URINE PREGNANCY: Preg Test, Ur: NEGATIVE

## 2023-01-26 MED ORDER — METFORMIN HCL 500 MG PO TABS: 500.0000 mg | ORAL_TABLET | Freq: Every day | ORAL | 0 refills | Status: DC

## 2023-01-26 MED ORDER — LOSARTAN POTASSIUM-HCTZ 50-12.5 MG PO TABS: 1.0000 | ORAL_TABLET | Freq: Every day | ORAL | 1 refills | Status: DC

## 2023-01-26 NOTE — Assessment & Plan Note (Signed)
BMI of 58.91. Previous attempts at weight management with [facility] were unsuccessful due to insurance denial. Discussed alternative weight management strategies including bariatric surgery. Explained that bariatric surgery can lead to significant weight loss and improve comorbid conditions. Discussed non-surgical options through bariatric programs for weight management medications. - Refer to bariatric surgery for weight management options

## 2023-01-26 NOTE — Assessment & Plan Note (Signed)
Intermittent swelling over the past four weeks, likely related to high sodium intake. No associated pain, chest pain, or shortness of breath. Differential includes renal or hepatic dysfunction, anemia, and thyroid disorder. Discussed importance of reducing sodium intake to manage symptoms. - subacute problem  - Order complete metabolic panel - Order hemoglobin A1c - Order thyroid function tests (TSH, T4, T3) - Order CBC - given DASH diet info

## 2023-01-26 NOTE — Assessment & Plan Note (Addendum)
Severely elevated blood pressure at 226/130 mmHg today. Previous readings were within normal limits. Symptoms include bilateral ankle and foot edema, likely exacerbated by high sodium intake. Discussed risks of untreated hypertension, including stroke and cardiovascular events. Patient prefers medication management over ED visit. Informed about need for immediate medical attention if experiencing chest pain, blurry vision, or severe headache. - newly elevated BP  - severely elevated, improved to 180/132 on recheck  - discussed severity of blood pressure elevated and risk of stroke at this high of a BP - discussed with patient that I would recommend ED evaluation today at this level, patient declines to visit ED at this time but is agreeable to starting antihypertensive with close follow up in 48 hours  - start losartan 50mg -hctz 25mg  once daily  - ordered CMP, A1c, Tsh/t4,t3 and CBC  - Prescribe antihypertensive medication - Recheck blood pressure on Friday at 9:40 AM - Instruct to monitor blood pressure at home - Advise ED visit if experiencing chest pain, blurry vision, or other concerning symptoms - Provided DASH diet information - UPT negative (hx of pre-eclampsia)

## 2023-01-26 NOTE — Progress Notes (Unsigned)
Will notify patient at her appointment on Friday, 01/28/23

## 2023-01-27 ENCOUNTER — Telehealth: Payer: Self-pay | Admitting: Family Medicine

## 2023-01-27 ENCOUNTER — Telehealth: Payer: Self-pay | Admitting: Physician Assistant

## 2023-01-27 ENCOUNTER — Encounter: Payer: Self-pay | Admitting: Intensive Care

## 2023-01-27 ENCOUNTER — Ambulatory Visit: Payer: Medicaid Other | Admitting: Physician Assistant

## 2023-01-27 ENCOUNTER — Other Ambulatory Visit: Payer: Self-pay

## 2023-01-27 ENCOUNTER — Encounter: Payer: Self-pay | Admitting: Family Medicine

## 2023-01-27 ENCOUNTER — Inpatient Hospital Stay
Admission: EM | Admit: 2023-01-27 | Discharge: 2023-01-28 | DRG: 683 | Disposition: A | Discharge status: Home / Self Care | Payer: Medicaid Other | Attending: Family Medicine | Admitting: Family Medicine

## 2023-01-27 DIAGNOSIS — N179 Acute kidney failure, unspecified: Secondary | ICD-10-CM | POA: Diagnosis not present

## 2023-01-27 DIAGNOSIS — Z880 Allergy status to penicillin: Secondary | ICD-10-CM

## 2023-01-27 DIAGNOSIS — E876 Hypokalemia: Secondary | ICD-10-CM | POA: Insufficient documentation

## 2023-01-27 DIAGNOSIS — Z803 Family history of malignant neoplasm of breast: Secondary | ICD-10-CM

## 2023-01-27 DIAGNOSIS — Z87891 Personal history of nicotine dependence: Secondary | ICD-10-CM

## 2023-01-27 DIAGNOSIS — I10 Essential (primary) hypertension: Secondary | ICD-10-CM | POA: Insufficient documentation

## 2023-01-27 DIAGNOSIS — Z79899 Other long term (current) drug therapy: Secondary | ICD-10-CM | POA: Diagnosis not present

## 2023-01-27 DIAGNOSIS — Z842 Family history of other diseases of the genitourinary system: Secondary | ICD-10-CM | POA: Diagnosis not present

## 2023-01-27 DIAGNOSIS — D72829 Elevated white blood cell count, unspecified: Secondary | ICD-10-CM | POA: Diagnosis not present

## 2023-01-27 DIAGNOSIS — Z833 Family history of diabetes mellitus: Secondary | ICD-10-CM | POA: Diagnosis not present

## 2023-01-27 DIAGNOSIS — Z8249 Family history of ischemic heart disease and other diseases of the circulatory system: Secondary | ICD-10-CM | POA: Diagnosis not present

## 2023-01-27 DIAGNOSIS — R7303 Prediabetes: Secondary | ICD-10-CM | POA: Diagnosis not present

## 2023-01-27 DIAGNOSIS — R809 Proteinuria, unspecified: Secondary | ICD-10-CM | POA: Diagnosis present

## 2023-01-27 DIAGNOSIS — Z7984 Long term (current) use of oral hypoglycemic drugs: Secondary | ICD-10-CM

## 2023-01-27 DIAGNOSIS — Z6841 Body Mass Index (BMI) 40.0 and over, adult: Secondary | ICD-10-CM | POA: Diagnosis not present

## 2023-01-27 DIAGNOSIS — R7989 Other specified abnormal findings of blood chemistry: Secondary | ICD-10-CM | POA: Diagnosis not present

## 2023-01-27 DIAGNOSIS — T39315A Adverse effect of propionic acid derivatives, initial encounter: Secondary | ICD-10-CM | POA: Diagnosis not present

## 2023-01-27 DIAGNOSIS — Z5941 Food insecurity: Secondary | ICD-10-CM | POA: Diagnosis not present

## 2023-01-27 DIAGNOSIS — E8809 Other disorders of plasma-protein metabolism, not elsewhere classified: Secondary | ICD-10-CM | POA: Diagnosis not present

## 2023-01-27 HISTORY — DX: Essential (primary) hypertension: I10

## 2023-01-27 LAB — CBC
Hematocrit: 36.6 % (ref 34.0–46.6)
Hemoglobin: 11.9 g/dL (ref 11.1–15.9)
MCH: 28.1 pg (ref 26.6–33.0)
MCHC: 32.5 g/dL (ref 31.5–35.7)
MCV: 87 fL (ref 79–97)
Platelets: 374 10*3/uL (ref 150–450)
RBC: 4.23 x10E6/uL (ref 3.77–5.28)
RDW: 13.1 % (ref 11.7–15.4)
WBC: 11.2 10*3/uL — ABNORMAL HIGH (ref 3.4–10.8)

## 2023-01-27 LAB — COMPREHENSIVE METABOLIC PANEL
ALT: 9 U/L (ref 0–44)
AST: 15 U/L (ref 15–41)
Albumin: 1.7 g/dL — ABNORMAL LOW (ref 3.5–5.0)
Alkaline Phosphatase: 79 U/L (ref 38–126)
Anion gap: 7 (ref 5–15)
BUN: 29 mg/dL — ABNORMAL HIGH (ref 6–20)
CO2: 22 mmol/L (ref 22–32)
Calcium: 7.1 mg/dL — ABNORMAL LOW (ref 8.9–10.3)
Chloride: 109 mmol/L (ref 98–111)
Creatinine, Ser: 3.33 mg/dL — ABNORMAL HIGH (ref 0.44–1.00)
GFR, Estimated: 18 mL/min — ABNORMAL LOW (ref >60)
Glucose, Bld: 101 mg/dL — ABNORMAL HIGH (ref 70–99)
Potassium: 3.3 mmol/L — ABNORMAL LOW (ref 3.5–5.1)
Sodium: 138 mmol/L (ref 135–145)
Total Bilirubin: 0.4 mg/dL (ref <1.2)
Total Protein: 5.7 g/dL — ABNORMAL LOW (ref 6.5–8.1)

## 2023-01-27 LAB — CBC WITH DIFFERENTIAL/PLATELET
Abs Immature Granulocytes: 0.05 10*3/uL (ref 0.00–0.07)
Basophils Absolute: 0.1 10*3/uL (ref 0.0–0.1)
Basophils Relative: 1 %
Eosinophils Absolute: 0.2 10*3/uL (ref 0.0–0.5)
Eosinophils Relative: 2 %
HCT: 36.1 % (ref 36.0–46.0)
Hemoglobin: 11.6 g/dL — ABNORMAL LOW (ref 12.0–15.0)
Immature Granulocytes: 0 %
Lymphocytes Relative: 28 %
Lymphs Abs: 3.2 10*3/uL (ref 0.7–4.0)
MCH: 28.2 pg (ref 26.0–34.0)
MCHC: 32.1 g/dL (ref 30.0–36.0)
MCV: 87.6 fL (ref 80.0–100.0)
Monocytes Absolute: 0.5 10*3/uL (ref 0.1–1.0)
Monocytes Relative: 5 %
Neutro Abs: 7.4 10*3/uL (ref 1.7–7.7)
Neutrophils Relative %: 64 %
Platelets: 354 10*3/uL (ref 150–400)
RBC: 4.12 MIL/uL (ref 3.87–5.11)
RDW: 13.2 % (ref 11.5–15.5)
WBC: 11.5 10*3/uL — ABNORMAL HIGH (ref 4.0–10.5)
nRBC: 0 % (ref 0.0–0.2)

## 2023-01-27 LAB — CMP14+EGFR
ALT: 7 [IU]/L (ref 0–32)
AST: 13 [IU]/L (ref 0–40)
Albumin: 2 g/dL — ABNORMAL LOW (ref 4.0–5.0)
Alkaline Phosphatase: 106 [IU]/L (ref 44–121)
BUN/Creatinine Ratio: 6 — ABNORMAL LOW (ref 9–23)
BUN: 20 mg/dL (ref 6–20)
Bilirubin Total: <0.2 mg/dL (ref 0.0–1.2)
CO2: 20 mmol/L (ref 20–29)
Calcium: 7.1 mg/dL — ABNORMAL LOW (ref 8.7–10.2)
Chloride: 109 mmol/L — ABNORMAL HIGH (ref 96–106)
Creatinine, Ser: 3.09 mg/dL — ABNORMAL HIGH (ref 0.57–1.00)
Globulin, Total: 2.7 g/dL (ref 1.5–4.5)
Glucose: 90 mg/dL (ref 70–99)
Potassium: 3.7 mmol/L (ref 3.5–5.2)
Sodium: 140 mmol/L (ref 134–144)
Total Protein: 4.7 g/dL — ABNORMAL LOW (ref 6.0–8.5)
eGFR: 20 mL/min/{1.73_m2} — ABNORMAL LOW (ref >59)

## 2023-01-27 LAB — TSH+T4F+T3FREE
Free T4: 0.78 ng/dL — ABNORMAL LOW (ref 0.82–1.77)
T3, Free: 2.4 pg/mL (ref 2.0–4.4)
TSH: 2.29 u[IU]/mL (ref 0.450–4.500)

## 2023-01-27 LAB — HEMOGLOBIN A1C
Est. average glucose Bld gHb Est-mCnc: 111 mg/dL
Hgb A1c MFr Bld: 5.5 % (ref 4.8–5.6)

## 2023-01-27 MED ORDER — ACETAMINOPHEN 650 MG RE SUPP: 650.0000 mg | Freq: Four times a day (QID) | RECTAL | Status: DC | PRN

## 2023-01-27 MED ORDER — ACETAMINOPHEN 325 MG PO TABS: 650.0000 mg | ORAL_TABLET | Freq: Four times a day (QID) | ORAL | Status: DC | PRN

## 2023-01-27 MED ORDER — TRAZODONE HCL 50 MG PO TABS: 25.0000 mg | ORAL_TABLET | Freq: Every evening | ORAL | Status: DC | PRN

## 2023-01-27 MED ORDER — ONDANSETRON HCL 4 MG/2ML IJ SOLN: 4.0000 mg | Freq: Four times a day (QID) | INTRAMUSCULAR | Status: DC | PRN

## 2023-01-27 MED ORDER — INSULIN ASPART 100 UNIT/ML IJ SOLN: 0.0000 [IU] | Freq: Every day | INTRAMUSCULAR | Status: DC

## 2023-01-27 MED ORDER — ONDANSETRON HCL 4 MG PO TABS: 4.0000 mg | ORAL_TABLET | Freq: Four times a day (QID) | ORAL | Status: DC | PRN

## 2023-01-27 MED ORDER — MAGNESIUM HYDROXIDE 400 MG/5ML PO SUSP: 30.0000 mL | Freq: Every day | ORAL | Status: DC | PRN

## 2023-01-27 MED ORDER — INSULIN ASPART 100 UNIT/ML IJ SOLN: 0.0000 [IU] | Freq: Three times a day (TID) | INTRAMUSCULAR | Status: DC

## 2023-01-27 MED ORDER — SODIUM CHLORIDE 0.9 % IV SOLN
INTRAVENOUS | Status: DC
Start: 2023-01-27 — End: 2023-01-28

## 2023-01-27 MED ORDER — HYDRALAZINE HCL 20 MG/ML IJ SOLN
10.0000 mg | Freq: Once | INTRAMUSCULAR | Status: AC
  Administered 2023-01-27: 10 mg via INTRAVENOUS
  Filled 2023-01-27: qty 1

## 2023-01-27 MED ORDER — SODIUM CHLORIDE 0.9 % IV BOLUS
1000.0000 mL | Freq: Once | INTRAVENOUS | Status: AC
  Administered 2023-01-27: 1000 mL via INTRAVENOUS

## 2023-01-27 MED ORDER — ENOXAPARIN SODIUM 80 MG/0.8ML IJ SOSY
75.0000 mg | PREFILLED_SYRINGE | INTRAMUSCULAR | Status: DC
  Filled 2023-01-27: qty 0.75

## 2023-01-27 NOTE — Progress Notes (Signed)
PHARMACIST - PHYSICIAN COMMUNICATION  CONCERNING:  Enoxaparin (Lovenox) for DVT Prophylaxis    RECOMMENDATION: Patient was prescribed enoxaprin 40mg  q24 hours for VTE prophylaxis.   Filed Weights   01/27/23 1550  Weight: (!) 154.2 kg (340 lb)    Body mass index is 56.58 kg/m.  Estimated Creatinine Clearance: 37.4 mL/min (A) (by C-G formula based on SCr of 3.33 mg/dL (H)).   Based on [facility] policy patient is candidate for enoxaparin 0.5mg /kg TBW SQ every 24 hours based on BMI being >30.  DESCRIPTION: Pharmacy has adjusted enoxaparin dose per [facility] policy.  Patient is now receiving enoxaparin 0.5 mg/kg every 24 hours   Otelia Sergeant, PharmD, [facility] 01/27/2023 11:26 PM

## 2023-01-27 NOTE — H&P (Addendum)
[place]   PATIENT NAME: Sierra Lee    MR#:  [id number]  DATE OF BIRTH:  Sep 03, 1990  DATE OF ADMISSION:  01/27/2023  PRIMARY CARE PHYSICIAN: Debera Lat, PA-C   Patient is coming from: Home  REQUESTING/REFERRING PHYSICIAN: Phineas Semen, MD  CHIEF COMPLAINT:   Chief Complaint  Patient presents with   Abnormal Lab    HISTORY OF PRESENT ILLNESS:  Sierra Lee is a 32 y.o. obese African-American female with medical history significant for essential hypertension and prediabetes with last A1c of 5.5 on 01/26/23, who presented to the emergency room with acute onset of abnormal labs with elevated creatinine on outpatient basis that was checked yesterday on call today to her by her PCP.  She has been taking 800 mg of IV Profen recently every 4 hours on an intermittent basis for tooth ache and headache.  She denies any fever or chills.  No nausea or vomiting or abdominal pain.  No dysuria, oliguria or hematuria or flank pain.  No headache or dizziness or blurred vision.  No paresthesias or focal muscle weakness.  ED Course: When she came to the ER BP was elevated at 226/130 with otherwise normal vital signs.  Labs revealed hypokalemia of 3.3 and BUN of 29 with creatinine 3.33 compared to 3.09 yesterday by her PCP and albumin of 1.7 with total protein 5.7.  GFR was 18.  CBC showed leukocytosis of 11.5 with hemoglobin 11.6 and hematocrit 36.1.  Urine pregnancy test was negative on 11/13. EKG as reviewed by me : EKG showed normal sinus rhythm with a rate of 93. Imaging: None.  The patient was given 10 mg of IV hydralazine and 1 L bolus of IV normal saline.  She will be admitted to a medical bed for further evaluation and management. PAST MEDICAL HISTORY:   Past Medical History:  Diagnosis Date   Allergy    Anemia    Hypertension     PAST SURGICAL HISTORY:   Past Surgical History:  Procedure Laterality Date   CESAREAN SECTION     CESAREAN SECTION WITH BILATERAL  TUBAL LIGATION Bilateral 07/16/2019   Procedure: CESAREAN SECTION WITH BILATERAL TUBAL LIGATION;  Surgeon: Hildred Laser, MD;  Location: [facility] ORS;  Service: Obstetrics;  Laterality: Bilateral;  Repeat C-Section    SOCIAL HISTORY:   Social History   Tobacco Use   Smoking status: Former    Current packs/day: 0.00    Types: Cigarettes    Quit date: 10/12/2018    Years since quitting: 4.2   Smokeless tobacco: Never  Substance Use Topics   Alcohol use: Not Currently    FAMILY HISTORY:   Family History  Problem Relation Age of Onset   Diabetes Mother    Hypertension Mother    Breast cancer Maternal Aunt    Diabetes Paternal Aunt    Breast cancer Maternal Grandmother     DRUG ALLERGIES:   Allergies  Allergen Reactions   Other Anaphylaxis, Hives and Rash    Pineapples   Penicillins Hives and Rash    REVIEW OF SYSTEMS:   ROS As per history of present illness. All pertinent systems were reviewed above. Constitutional, HEENT, cardiovascular, respiratory, GI, GU, musculoskeletal, neuro, psychiatric, endocrine, integumentary and hematologic systems were reviewed and are otherwise negative/unremarkable except for positive findings mentioned above in the HPI.   MEDICATIONS AT HOME:   Prior to Admission medications   Medication Sig Start Date End Date Taking? Authorizing Provider  losartan-hydrochlorothiazide (HYZAAR) 50-12.5 MG  tablet Take 1 tablet by mouth daily. 01/26/23   Simmons-Robinson, Tawanna Cooler, MD  metFORMIN (GLUCOPHAGE) 500 MG tablet Take 1 tablet (500 mg total) by mouth daily with breakfast. 01/26/23   Ostwalt, Edmon Crape, PA-C      VITAL SIGNS:  Blood pressure (!) 148/78, pulse 77, temperature 98.7 F (37.1 C), temperature source Oral, resp. rate 19, height 5\' 5"  (1.651 m), weight (!) 154.2 kg, last menstrual period 01/26/2023, SpO2 100%, not currently breastfeeding.  PHYSICAL EXAMINATION:  Physical Exam  GENERAL:  32 y.o.-year-old obese African-American female patient  lying in the bed with no acute distress.  EYES: Pupils equal, round, reactive to light and accommodation. No scleral icterus. Extraocular muscles intact.  HEENT: Head atraumatic, normocephalic. Oropharynx and nasopharynx clear.  NECK:  Supple, no jugular venous distention. No thyroid enlargement, no tenderness.  LUNGS: Normal breath sounds bilaterally, no wheezing, rales,rhonchi or crepitation. No use of accessory muscles of respiration.  CARDIOVASCULAR: Regular rate and rhythm, S1, S2 normal. No murmurs, rubs, or gallops.  ABDOMEN: Soft, nondistended, nontender. Bowel sounds present. No organomegaly or mass.  EXTREMITIES: No pedal edema, cyanosis, or clubbing.  NEUROLOGIC: Cranial nerves II through XII are intact. Muscle strength 5/5 in all extremities. Sensation intact. Gait not checked.  PSYCHIATRIC: The patient is alert and oriented x 3.  Normal affect and good eye contact. SKIN: No obvious rash, lesion, or ulcer.   LABORATORY PANEL:   CBC Recent Labs  Lab 01/27/23 1557  WBC 11.5*  HGB 11.6*  HCT 36.1  PLT 354   ------------------------------------------------------------------------------------------------------------------  Chemistries  Recent Labs  Lab 01/27/23 1557  NA 138  K 3.3*  CL 109  CO2 22  GLUCOSE 101*  BUN 29*  CREATININE 3.33*  CALCIUM 7.1*  AST 15  ALT 9  ALKPHOS 79  BILITOT 0.4   ------------------------------------------------------------------------------------------------------------------  Cardiac Enzymes No results for input(s): "TROPONINI" in the last 168 hours. ------------------------------------------------------------------------------------------------------------------  RADIOLOGY:  No results found.    IMPRESSION AND PLAN:  Assessment and Plan: * AKI (acute kidney injury) (HCC) - The patient be admitted to a medical bed. 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 - She will be hydrated with IV normal saline. - We  will avoid nephrotoxins. - Will follow BMPs. - Will obtain bilateral renal ultrasound. - Nephrology consult to be obtained. - Dr. Cherylann Ratel was notified about the patient.  Essential hypertension - We will hold off Hyzaar and place her on as needed IV labetalol and hydralazine. - Will add scheduled p.o. Norvasc.  Hypokalemia - We will replace potassium.       DVT prophylaxis: Lovenox.  Advanced Care Planning:  Code Status: full code.  Family Communication:  The plan of care was discussed in details with the patient (and family). I answered all questions. The patient agreed to proceed with the above mentioned plan. Further management will depend upon hospital course. Disposition Plan: Back to previous home environment Consults called: nephrology All the records are reviewed and case discussed with ED provider.  Status is: Inpatient    At the time of the admission, it appears that the appropriate admission status for this patient is inpatient.  This is judged to be reasonable and necessary in order to provide the required intensity of service to ensure the patient's safety given the presenting symptoms, physical exam findings and initial radiographic and laboratory data in the context of comorbid conditions.  The patient requires inpatient status due to high intensity of service, high risk of further deterioration and  high frequency of surveillance required.  I certify that at the time of admission, it is my clinical judgment that the patient will require inpatient hospital care extending more than 2 midnights.                            Dispo: The patient is from: Home              Anticipated d/c is to: Home              Patient currently is not medically stable to d/c.              Difficult to place patient: No  Hannah Beat M.D on 01/28/2023 at 5:29 AM  [facility]   From 7 PM-7 AM, contact night-coverage [email or web address]  CC: Primary care physician; Debera Lat,  PA-C

## 2023-01-27 NOTE — ED Triage Notes (Signed)
Patient reports she was sent by her PCP for abnormal kidney labs. Reports she was also placed on blood pressure medication yesterday

## 2023-01-27 NOTE — ED Provider Notes (Signed)
[facility] Provider Note    Event Date/Time   First MD Initiated Contact with Patient 01/27/23 2113     (approximate)   History   Abnormal Lab   HPI  Sierra Lee is a 32 y.o. female who presents to the emergency department today because of concerns for abnormal blood work drawn yesterday primary care doctor's office.  She was found to have elevated creatinine.  The patient states that she was just started on blood pressure medication yesterday.  She has however been taking a large amount of ibuprofen recently.  She has been taking 800 mg every 4 hours.  There is family history of high blood pressure and kidney injury.     Physical Exam   Triage Vital Signs: ED Triage Vitals  Encounter Vitals Group     BP 01/27/23 1555 (!) 208/125     Systolic BP Percentile --      Diastolic BP Percentile --      Pulse Rate 01/27/23 1555 95     Resp 01/27/23 1555 16     Temp 01/27/23 1555 98.4 F (36.9 C)     Temp Source 01/27/23 1555 Oral     SpO2 01/27/23 1555 100 %     Weight 01/27/23 1550 (!) 340 lb (154.2 kg)     Height 01/27/23 1550 5\' 5"  (1.651 m)     Head Circumference --      Peak Flow --      Pain Score 01/27/23 1550 0     Pain Loc --      Pain Education --      Exclude from Growth Chart --     Most recent vital signs: Vitals:   01/27/23 1555 01/27/23 1824  BP: (!) 208/125 (!) 192/118  Pulse: 95 86  Resp: 16 18  Temp: 98.4 F (36.9 C) 99.1 F (37.3 C)  SpO2: 100% 99%   General: Awake, alert, oriented. CV:  Good peripheral perfusion. Regular rate and rhythm. Resp:  Normal effort. Lungs clear. Abd:  No distention. Non tender.    ED Results / Procedures / Treatments   Labs (all labs ordered are listed, but only abnormal results are displayed) Labs Reviewed  CBC WITH DIFFERENTIAL/PLATELET - Abnormal; Notable for the following components:      Result Value   WBC 11.5 (*)    Hemoglobin 11.6 (*)    All other components within normal  limits  COMPREHENSIVE METABOLIC PANEL - Abnormal; Notable for the following components:   Potassium 3.3 (*)    Glucose, Bld 101 (*)    BUN 29 (*)    Creatinine, Ser 3.33 (*)    Calcium 7.1 (*)    Total Protein 5.7 (*)    Albumin 1.7 (*)    GFR, Estimated 18 (*)    All other components within normal limits     EKG  I, Phineas Semen, attending physician, personally viewed and interpreted this EKG  EKG Time: 1559 Rate: 93 Rhythm: normal sinus rhythm Axis: normal Intervals: qtc 452 QRS: narrow, q waves II, III, aVF ST changes: no st elevation Impression: abnormal ekg   RADIOLOGY None   PROCEDURES:  Critical Care performed: No    MEDICATIONS ORDERED IN ED: Medications - No data to display   IMPRESSION / MDM / ASSESSMENT AND PLAN / ED COURSE  I reviewed the triage vital signs and the nursing notes.  Differential diagnosis includes, but is not limited to, dehydration, NSAID use, hypertension  Patient's presentation is most consistent with acute presentation with potential threat to life or bodily function.  Patient presented to the emergency department today because of concerns for acute kidney injury found on outpatient blood work performed yesterday.  Patient denies any abdominal pain.  Blood work here confirms AKI.  I think this is likely multifactorial with hypertension and NSAID use being primarily the causes.  Will start IV fluids.  Discussed with Dr. Arville Care with the hospitalist service who will evaluate for admission.      FINAL CLINICAL IMPRESSION(S) / ED DIAGNOSES   Final diagnoses:  AKI (acute kidney injury) (HCC)    Note:  This document was prepared using Dragon voice recognition software and may include unintentional dictation errors.    Phineas Semen, MD 01/27/23 2216

## 2023-01-27 NOTE — Telephone Encounter (Signed)
Contacted patient via telephone to discuss results of labs collected on January 26, 2023.  Informed patient that her creatinine was elevated at 3.09, explained that normal kidney function is 1.0 or less.  Discussed with patient that this could be contributing to her severely elevated blood pressure Patient states that she checked blood pressure earlier today using a wrist cuff and blood pressure was reportedly 128/80  I also explained to patient that her GFR was 20 and that the normal range is greater than 59 for this number.  Recommended that patient be evaluated in the ED today for AKI in order to obtain repeat labs and possible admission for treatment  Patient confirmed that she has been urinating normally and that she started the losartan hydrochlorothiazide 50-12.5 mg that was prescribed yesterday.  Patient states that she was previously taking ibuprofen and aspirin for headaches for several days prior to her appointment on 01/26/2023.  I informed the patient that this likely contributed to her abnormal kidney function and that she should not take any more NSAIDS or ASA  Patient states that she will seek care in the ED but has to wait until her children's father returns home.  She reports that she will go to the ED prior to this evening.   Informed patient that I will wait to see notes from her ED evaluation and follow-up with her in clinic   Pt has appt scheduled for 01/28/23    Ronnald Ramp, MD

## 2023-01-27 NOTE — Telephone Encounter (Signed)
Pt called to see if Dr. Sharol Harness was on her way to the hospital. Pt stated the Dr. Sharol Harness would be at the hospital before the patient arrived. Office staff checked with the provider and TE states for the patient to go the ED. Provider will review the notes from the ED and pt will follow up with scheduled appt on 01/28/23. Please advise if patient calls back. As the call was dc.

## 2023-01-28 ENCOUNTER — Inpatient Hospital Stay: Payer: Medicaid Other

## 2023-01-28 ENCOUNTER — Encounter: Payer: Self-pay | Admitting: Family Medicine

## 2023-01-28 ENCOUNTER — Telehealth: Payer: Self-pay

## 2023-01-28 ENCOUNTER — Ambulatory Visit: Payer: Medicaid Other | Admitting: Family Medicine

## 2023-01-28 DIAGNOSIS — N179 Acute kidney failure, unspecified: Secondary | ICD-10-CM | POA: Diagnosis not present

## 2023-01-28 DIAGNOSIS — R809 Proteinuria, unspecified: Secondary | ICD-10-CM | POA: Diagnosis not present

## 2023-01-28 DIAGNOSIS — R601 Generalized edema: Secondary | ICD-10-CM | POA: Diagnosis not present

## 2023-01-28 DIAGNOSIS — E876 Hypokalemia: Secondary | ICD-10-CM | POA: Insufficient documentation

## 2023-01-28 DIAGNOSIS — I10 Essential (primary) hypertension: Secondary | ICD-10-CM | POA: Insufficient documentation

## 2023-01-28 LAB — CBC
HCT: 33.3 % — ABNORMAL LOW (ref 36.0–46.0)
Hemoglobin: 10.7 g/dL — ABNORMAL LOW (ref 12.0–15.0)
MCH: 27.9 pg (ref 26.0–34.0)
MCHC: 32.1 g/dL (ref 30.0–36.0)
MCV: 86.7 fL (ref 80.0–100.0)
Platelets: 333 10*3/uL (ref 150–400)
RBC: 3.84 MIL/uL — ABNORMAL LOW (ref 3.87–5.11)
RDW: 13.5 % (ref 11.5–15.5)
WBC: 11.4 10*3/uL — ABNORMAL HIGH (ref 4.0–10.5)
nRBC: 0 % (ref 0.0–0.2)

## 2023-01-28 LAB — URINALYSIS, COMPLETE (UACMP) WITH MICROSCOPIC
Bacteria, UA: NONE SEEN
Bilirubin Urine: NEGATIVE
Glucose, UA: 150 mg/dL — AB
Ketones, ur: NEGATIVE mg/dL
Leukocytes,Ua: NEGATIVE
Nitrite: NEGATIVE
Protein, ur: >=300 mg/dL — AB
Specific Gravity, Urine: 1.011 (ref 1.005–1.030)
pH: 7 (ref 5.0–8.0)

## 2023-01-28 LAB — BASIC METABOLIC PANEL
Anion gap: 8 (ref 5–15)
BUN: 25 mg/dL — ABNORMAL HIGH (ref 6–20)
CO2: 19 mmol/L — ABNORMAL LOW (ref 22–32)
Calcium: 7.2 mg/dL — ABNORMAL LOW (ref 8.9–10.3)
Chloride: 111 mmol/L (ref 98–111)
Creatinine, Ser: 3.06 mg/dL — ABNORMAL HIGH (ref 0.44–1.00)
GFR, Estimated: 20 mL/min — ABNORMAL LOW (ref >60)
Glucose, Bld: 102 mg/dL — ABNORMAL HIGH (ref 70–99)
Potassium: 3.1 mmol/L — ABNORMAL LOW (ref 3.5–5.1)
Sodium: 138 mmol/L (ref 135–145)

## 2023-01-28 LAB — SODIUM, URINE, RANDOM: Sodium, Ur: 71 mmol/L

## 2023-01-28 LAB — CBG MONITORING, ED
Glucose-Capillary: 105 mg/dL — ABNORMAL HIGH (ref 70–99)
Glucose-Capillary: 428 mg/dL — ABNORMAL HIGH (ref 70–99)

## 2023-01-28 LAB — GLUCOSE, RANDOM: Glucose, Bld: 95 mg/dL (ref 70–99)

## 2023-01-28 LAB — CREATININE, URINE, RANDOM: Creatinine, Urine: 48 mg/dL

## 2023-01-28 MED ORDER — HYDRALAZINE HCL 20 MG/ML IJ SOLN: 10.0000 mg | Freq: Four times a day (QID) | INTRAMUSCULAR | Status: DC | PRN

## 2023-01-28 MED ORDER — LABETALOL HCL 5 MG/ML IV SOLN: 20.0000 mg | INTRAVENOUS | Status: DC | PRN

## 2023-01-28 MED ORDER — AMLODIPINE BESYLATE 10 MG PO TABS: 10.0000 mg | ORAL_TABLET | Freq: Every day | ORAL | 0 refills | Status: AC

## 2023-01-28 MED ORDER — AMLODIPINE BESYLATE 5 MG PO TABS
10.0000 mg | ORAL_TABLET | Freq: Every day | ORAL | Status: DC
  Administered 2023-01-28: 10 mg via ORAL
  Filled 2023-01-28: qty 2

## 2023-01-28 MED ORDER — HEPARIN SODIUM (PORCINE) 5000 UNIT/ML IJ SOLN
5000.0000 [IU] | Freq: Three times a day (TID) | INTRAMUSCULAR | Status: DC
  Administered 2023-01-28: 5000 [IU] via SUBCUTANEOUS
  Filled 2023-01-28: qty 1

## 2023-01-28 MED ORDER — ACETAMINOPHEN 325 MG PO TABS: 650.0000 mg | ORAL_TABLET | Freq: Four times a day (QID) | ORAL | Status: AC | PRN

## 2023-01-28 NOTE — Assessment & Plan Note (Signed)
-   The patient be admitted to a medical bed. 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 - She will be hydrated with IV normal saline. - We will avoid nephrotoxins. - Will follow BMPs. - Will obtain bilateral renal ultrasound. - Nephrology consult to be obtained. - Dr. Cherylann Ratel was notified about the patient.

## 2023-01-28 NOTE — Progress Notes (Deleted)
PROGRESS NOTE    Sierra Lee   NGE:[id number] DOB: 1990/09/10  DOA: 01/27/2023 Date of Service: 01/28/23 which is hospital day 1  PCP: Debera Lat, PA-C    HPI: Sierra Lee is a 32 y.o. female with medical history significant for essential hypertension, obesity, prediabetes with last A1c of 5.5 on 01/26/23. She presented to the emergency room at instruction of her PCP 01/27/23 d/t abnormal labs with elevated creatinine.  She has been taking 800 mg ibuprofen recently every 4 hours on an intermittent basis for tooth ache and headache.    Hospital course / significant events:  11/14: to ED. BP 226/130, Cr 3.33 (vs 3.09 at PCP day prior).  11/15: BP improved 144/88 this morning. Cr to 3.06. Renal US pending, glomerulonephropathy w/u pending   Consultants:  Nephrology   Procedures/Surgeries: none      ASSESSMENT & PLAN:   AKI (acute kidney injury) Proteinuria urine protein greater than 300 mg/dL  Lower extremity edema with hypoalbuminemia.  DDX: NSAID induced nephropathy (minimal-change and FSGS), other medical renal disease / glomerulonephropathy  avoid nephrotoxins follow BMPs renal ultrasound. Nephrology following  obtain urine protein to creatinine ratio. check ANCA antibodies, GBM antibodies, C3, C4, ANA, SPEP, UPEP, HIV screen.  may need to consider renal biopsy as well but this is not urgent at the moment.  Further plan as patient progresses.    Essential hypertension as needed IV labetalol and hydralazine. add scheduled p.o. Norvasc.   Hypokalemia Replace as needed Monitor BMP     Morbid obesity based on BMI: Body mass index is 56.58 kg/m. Complicates care/prognosis  Underweight - under 18.5  normal weight - 18.5 to 24.9 overweight - 25 to 29.9 obese - 30 or more   DVT prophylaxis: heparin sq IV fluids: NS 125 mL/h continuous IV fluids  Nutrition: cardiac/carb diet  Central lines / invasive devices: none  Code Status: FULL CODE ACP  documentation reviewed: 01/28/23 and none on file in [facility]  TOC needs: none at this time  Barriers to dispo / significant pending items: renal function, further renal tests              Subjective / Brief ROS:  Patient reports edema as only complaint, otherwise feels ok Denies CP/SOB.  Pain controlled.  Denies new weakness.  Tolerating diet.  Reports no concerns w/ urination/defecation.   Family Communication: support person at bedside on rounds     Objective Findings:  Vitals:   01/28/23 0255 01/28/23 0612 01/28/23 0900 01/28/23 1200  BP: (!) 148/78 (!) 144/88 (!) 142/86 (!) 136/90  Pulse: 77 80 85 82  Resp: 19 20 18 20   Temp:  98.6 F (37 C) 98.7 F (37.1 C) 98.5 F (36.9 C)  TempSrc:  Oral Oral Oral  SpO2: 100% 100% 100% 100%  Weight:      Height:       No intake or output data in the 24 hours ending 01/28/23 1548 Filed Weights   01/27/23 1550  Weight: (!) 154.2 kg    Examination:  Physical Exam Constitutional:      General: She is not in acute distress.    Appearance: She is not ill-appearing.  Cardiovascular:     Rate and Rhythm: Normal rate and regular rhythm.     Heart sounds: Normal heart sounds.  Pulmonary:     Effort: Pulmonary effort is normal.     Breath sounds: Normal breath sounds.  Musculoskeletal:     Right lower leg: Edema present.  Left lower leg: Edema present.  Skin:    General: Skin is warm and dry.  Neurological:     General: No focal deficit present.     Mental Status: She is alert and oriented to person, place, and time.  Psychiatric:        Mood and Affect: Mood normal.        Behavior: Behavior normal.          Scheduled Medications:   amLODipine  10 mg Oral Daily   heparin injection (subcutaneous)  5,000 Units Subcutaneous Q8H    Continuous Infusions:    PRN Medications:  acetaminophen **OR** acetaminophen, hydrALAZINE, labetalol, magnesium hydroxide, ondansetron **OR** ondansetron (ZOFRAN) IV,  traZODone  Antimicrobials from admission:  Anti-infectives (From admission, onward)    None           Data Reviewed:  I have personally reviewed the following...  CBC: Recent Labs  Lab 01/26/23 1043 01/27/23 1557 01/28/23 0424  WBC 11.2* 11.5* 11.4*  NEUTROABS  --  7.4  --   HGB 11.9 11.6* 10.7*  HCT 36.6 36.1 33.3*  MCV 87 87.6 86.7  PLT 374 354 333   Basic Metabolic Panel: Recent Labs  Lab 01/26/23 1043 01/27/23 1557 01/28/23 0710 01/28/23 1241  NA 140 138 138  --   K 3.7 3.3* 3.1*  --   CL 109* 109 111  --   CO2 20 22 19*  --   GLUCOSE 90 101* 102* 95  BUN 20 29* 25*  --   CREATININE 3.09* 3.33* 3.06*  --   CALCIUM 7.1* 7.1* 7.2*  --    GFR: Estimated Creatinine Clearance: 40.7 mL/min (A) (by C-G formula based on SCr of 3.06 mg/dL (H)). Liver Function Tests: Recent Labs  Lab 01/26/23 1043 01/27/23 1557  AST 13 15  ALT 7 9  ALKPHOS 106 79  BILITOT <0.2 0.4  PROT 4.7* 5.7*  ALBUMIN 2.0* 1.7*   No results for input(s): "LIPASE", "AMYLASE" in the last 168 hours. No results for input(s): "AMMONIA" in the last 168 hours. Coagulation Profile: No results for input(s): "INR", "PROTIME" in the last 168 hours. Cardiac Enzymes: No results for input(s): "CKTOTAL", "CKMB", "CKMBINDEX", "TROPONINI" in the last 168 hours. BNP (last 3 results) No results for input(s): "PROBNP" in the last 8760 hours. HbA1C: Recent Labs    01/26/23 1043  HGBA1C 5.5   CBG: Recent Labs  Lab 01/28/23 0757 01/28/23 1140  GLUCAP 105* 428*   Lipid Profile: No results for input(s): "CHOL", "HDL", "LDLCALC", "TRIG", "CHOLHDL", "LDLDIRECT" in the last 72 hours. Thyroid Function Tests: Recent Labs    01/26/23 1043  TSH 2.290  FREET4 0.78*  T3FREE 2.4   Anemia Panel: No results for input(s): "VITAMINB12", "FOLATE", "FERRITIN", "TIBC", "IRON", "RETICCTPCT" in the last 72 hours. Most Recent Urinalysis On File:     Component Value Date/Time   COLORURINE STRAW (A)  01/28/2023 1029   APPEARANCEUR CLEAR (A) 01/28/2023 1029   APPEARANCEUR Clear 02/20/2019 1633   LABSPEC 1.011 01/28/2023 1029   PHURINE 7.0 01/28/2023 1029   GLUCOSEU 150 (A) 01/28/2023 1029   HGBUR SMALL (A) 01/28/2023 1029   BILIRUBINUR NEGATIVE 01/28/2023 1029   BILIRUBINUR neg 07/10/2019 1005   BILIRUBINUR Negative 02/20/2019 1633   KETONESUR NEGATIVE 01/28/2023 1029   PROTEINUR >=300 (A) 01/28/2023 1029   UROBILINOGEN 0.2 07/10/2019 1005   NITRITE NEGATIVE 01/28/2023 1029   LEUKOCYTESUR NEGATIVE 01/28/2023 1029   Sepsis Labs: @LABRCNTIP (procalcitonin:4,lacticidven:4) Microbiology: No results found for this or  any previous visit (from the past 240 hour(s)).    Radiology Studies last 3 days: No results found.    Sunnie Nielsen, DO [facility] 01/28/2023, 3:48 PM    Dictation software may have been used to generate the above note. Typos may occur and escape review in typed/dictated notes. Please contact Dr Lyn Hollingshead directly for clarity if needed.  Staff may message me via secure chat in Epic  but this may not receive an immediate response,  please page me for urgent matters!  If 7PM-7AM, please contact night coverage [email or web address]

## 2023-01-28 NOTE — Telephone Encounter (Signed)
Returned call.   Patient has been discharged and started on HTN medication, reports BP was improving, AKI was improved prior to DC from hospital Pt reports she is feeling better and has stopped NSAIDS and ASA containing meds as directed   Patient  requesting to schedule follow up appt within one week   No PCP appts available  No appts available for my schedule   Pt was scheduled with Merita Norton for 02/02/23 at 11:00A  Ronnald Ramp, MD

## 2023-01-28 NOTE — Telephone Encounter (Signed)
Copied from CRM [id number]. Topic: General - Other >> Jan 28, 2023  1:41 PM Franchot Heidelberg wrote: Reason for CRM: Pt called requesting to speak to Dr. Roxan Hockey about her visit to the ED to get fluids. She is still at the ED now.

## 2023-01-28 NOTE — Discharge Summary (Signed)
Physician Discharge Summary   Patient: Sierra Lee MRN: [id number]  DOB: 11-Aug-1990   Admit:     Date of Admission: 01/27/2023 Admitted from: home   Discharge: Date of discharge: 01/28/23 Disposition: Home Condition at discharge: good  CODE STATUS: FULL CODE     Discharge Physician: Sunnie Nielsen, DO [facility]     PCP: Debera Lat, PA-C  Recommendations for Outpatient Follow-up:  Follow up with PCP Debera Lat, PA-C in ASAP  Please obtain labs/tests: BMP in 2 days Please follow up on the following pending results: see below - multiple pending results  PCP AND OTHER OUTPATIENT PROVIDERS: SEE BELOW FOR SPECIFIC DISCHARGE INSTRUCTIONS PRINTED FOR PATIENT IN ADDITION TO GENERIC AVS PATIENT INFO     Discharge Instructions     Diet - low sodium heart healthy   Complete by: As directed    Discharge instructions   Complete by: As directed    IN SHARED DECISION MAKING W/ DISCUSSION OF RISKS/BENEFITS OF STAYING VS GOING HOME, WE HAVE DECIDED TO DISCHARGE W/ CLOSE OUTPATIENT FOLLOW UP AND NEW PRESCRIPTION FOR BLOOD PRESSURE. YOU HAVE BEEN ADVISED THAT BY NOT STAYING IN THE HOSPITAL, WE MAY MISS OR DELAY DIAGNOSIS/TREATMENT OF UNDERLYING PROBLEM, OR WE MAY MISS WORSENING OF KIDNEY FUNCTION WHICH PUTS YOU AT RISK OF NEEDING DIALYSIS OR DEVELOPING OTHER SERIOUS ILLNESS IN THE FUTURE.   Increase activity slowly   Complete by: As directed          Discharge Diagnoses: Principal Problem:   AKI (acute kidney injury) (HCC) Active Problems:   Hypokalemia   Essential hypertension        HPI: Sierra Lee is a 32 y.o. female with medical history significant for essential hypertension, obesity, prediabetes with last A1c of 5.5 on 01/26/23. She presented to the emergency room at instruction of her PCP 01/27/23 d/t abnormal labs with elevated creatinine.  She has been taking 800 mg ibuprofen recently every 4 hours on an intermittent basis for tooth ache  and headache.    Hospital course / significant events:  11/14: to ED. BP 226/130, Cr 3.33 (vs 3.09 at PCP day prior).  11/15: BP improved 144/88 this morning. Cr to 3.06. Renal US pending, glomerulonephropathy w/u pending. Pt desired for discharge home - we discussed risk/benefit, ideally would recommend staying to complete workup and continue trending labs to determine best dx/tx. Pt was advised on risks of leaving now. Felt appropriate to discharge w/ Rx for antihypertensives as opposed to leave AMA w/ no meds provided. Was stressed to patient that she needed outpatient follow up asap   Consultants:  Nephrology   Procedures/Surgeries: none      ASSESSMENT & PLAN:   AKI (acute kidney injury) Proteinuria urine protein greater than 300 mg/dL  Lower extremity edema with hypoalbuminemia.  DDX: NSAID induced nephropathy (minimal-change and FSGS), other medical renal disease / glomerulonephropathy  avoid nephrotoxins - pt has thrown out NSAID's follow BMPs - will need to be outpatient renal ultrasound - pending Nephrology following - recs for staying but pt desires to go home, will ned outpatient f/u urine protein to creatinine ratio - pending ANCA antibodies, GBM antibodies, C3, C4, ANA, SPEP, UPEP, HIV screen - pending  may need to consider renal biopsy as well but this is not urgent at the moment.  Further plan as patient progresses.    Essential hypertension D/c home losartan-HCTZ add scheduled p.o. Norvasc.- sending Rx for this, felt to be appropriate for formal discharge to proide antihypertensives  rx vs sign out AMA, see above detailed d/w patient    Hypokalemia Replace as needed Monitor BMP     Morbid obesity based on BMI: Body mass index is 56.58 kg/m. Complicates care/prognosis  Underweight - under 18.5  normal weight - 18.5 to 24.9 overweight - 25 to 29.9 obese - 30 or more            Discharge Instructions  Allergies as of 01/28/2023        Reactions   Other Anaphylaxis, Hives, Rash   Pineapples   Penicillins Hives, Rash        Medication List     STOP taking these medications    losartan-hydrochlorothiazide 50-12.5 MG tablet Commonly known as: HYZAAR   metFORMIN 500 MG tablet Commonly known as: GLUCOPHAGE       TAKE these medications    acetaminophen 325 MG tablet Commonly known as: TYLENOL Take 2 tablets (650 mg total) by mouth every 6 (six) hours as needed for mild pain (pain score 1-3) (or Fever >/= 101).   amLODipine 10 MG tablet Commonly known as: NORVASC Take 1 tablet (10 mg total) by mouth daily. Start taking on: January 29, 2023         Follow-up Information     Mercy Moore, [place]mon [place], [place]. Schedule an appointment as soon as possible for a visit.   Specialty: Physician Assistant Why: HOSPITAL FOLLOW UP ASAP Contact information: [address] [place] [address] [phone]                 Allergies  Allergen Reactions   Other Anaphylaxis, Hives and Rash    Pineapples   Penicillins Hives and Rash     Subjective: Patient reports edema as only complaint, otherwise feels ok Denies CP/SOB.  Pain controlled.  Denies new weakness.  Tolerating diet.  Reports no concerns w/ urination/defecation.    Discharge Exam: BP (!) 132/95 (BP Location: Right Arm)   Pulse 75   Temp 98.6 F (37 C) (Oral)   Resp (!) 22   Ht 5\' 5"  (1.651 m)   Wt (!) 154.2 kg   LMP 01/26/2023 (Exact Date) Comment: tubal ligation  SpO2 98%   Breastfeeding No   BMI 56.58 kg/m  General: Pt is alert, awake, not in acute distress Cardiovascular: RRR, S1/S2 +, no rubs, no gallops Respiratory: CTA bilaterally, no wheezing, no rhonchi Abdominal: Soft, NT, ND, bowel sounds Extremities: +1 edema, no cyanosis     The results of significant diagnostics from this hospitalization (including imaging, microbiology, ancillary and laboratory) are listed below for reference.     Microbiology: No  results found for this or any previous visit (from the past 240 hour(s)).   Labs: BNP (last 3 results) No results for input(s): "BNP" in the last 8760 hours. Basic Metabolic Panel: Recent Labs  Lab 01/26/23 1043 01/27/23 1557 01/28/23 0710 01/28/23 1241  NA 140 138 138  --   K 3.7 3.3* 3.1*  --   CL 109* 109 111  --   CO2 20 22 19*  --   GLUCOSE 90 101* 102* 95  BUN 20 29* 25*  --   CREATININE 3.09* 3.33* 3.06*  --   CALCIUM 7.1* 7.1* 7.2*  --    Liver Function Tests: Recent Labs  Lab 01/26/23 1043 01/27/23 1557  AST 13 15  ALT 7 9  ALKPHOS 106 79  BILITOT <0.2 0.4  PROT 4.7* 5.7*  ALBUMIN 2.0* 1.7*   No results for input(s): "  LIPASE", "AMYLASE" in the last 168 hours. No results for input(s): "AMMONIA" in the last 168 hours. CBC: Recent Labs  Lab 01/26/23 1043 01/27/23 1557 01/28/23 0424  WBC 11.2* 11.5* 11.4*  NEUTROABS  --  7.4  --   HGB 11.9 11.6* 10.7*  HCT 36.6 36.1 33.3*  MCV 87 87.6 86.7  PLT 374 354 333   Cardiac Enzymes: No results for input(s): "CKTOTAL", "CKMB", "CKMBINDEX", "TROPONINI" in the last 168 hours. BNP: Invalid input(s): "POCBNP" CBG: Recent Labs  Lab 01/28/23 0757 01/28/23 1140  GLUCAP 105* 428*   D-Dimer No results for input(s): "DDIMER" in the last 72 hours. Hgb A1c Recent Labs    01/26/23 1043  HGBA1C 5.5   Lipid Profile No results for input(s): "CHOL", "HDL", "LDLCALC", "TRIG", "CHOLHDL", "LDLDIRECT" in the last 72 hours. Thyroid function studies Recent Labs    01/26/23 1043  TSH 2.290  T3FREE 2.4   Anemia work up No results for input(s): "VITAMINB12", "FOLATE", "FERRITIN", "TIBC", "IRON", "RETICCTPCT" in the last 72 hours. Urinalysis    Component Value Date/Time   COLORURINE STRAW (A) 01/28/2023 1029   APPEARANCEUR CLEAR (A) 01/28/2023 1029   APPEARANCEUR Clear 02/20/2019 1633   LABSPEC 1.011 01/28/2023 1029   PHURINE 7.0 01/28/2023 1029   GLUCOSEU 150 (A) 01/28/2023 1029   HGBUR SMALL (A) 01/28/2023  1029   BILIRUBINUR NEGATIVE 01/28/2023 1029   BILIRUBINUR neg 07/10/2019 1005   BILIRUBINUR Negative 02/20/2019 1633   KETONESUR NEGATIVE 01/28/2023 1029   PROTEINUR >=300 (A) 01/28/2023 1029   UROBILINOGEN 0.2 07/10/2019 1005   NITRITE NEGATIVE 01/28/2023 1029   LEUKOCYTESUR NEGATIVE 01/28/2023 1029   Sepsis Labs Recent Labs  Lab 01/26/23 1043 01/27/23 1557 01/28/23 0424  WBC 11.2* 11.5* 11.4*   Microbiology No results found for this or any previous visit (from the past 240 hour(s)). Imaging No results found.    Time coordinating discharge: over 30 minutes  SIGNED:  Sunnie Nielsen DO [facility]

## 2023-01-28 NOTE — Hospital Course (Addendum)
HPI: Sierra Lee is a 32 y.o. female with medical history significant for essential hypertension, obesity, prediabetes with last A1c of 5.5 on 01/26/23. She presented to the emergency room at instruction of her PCP 01/27/23 d/t abnormal labs with elevated creatinine.  She has been taking 800 mg ibuprofen recently every 4 hours on an intermittent basis for tooth ache and headache.    Hospital course / significant events:  11/14: to ED. BP 226/130, Cr 3.33 (vs 3.09 at PCP day prior).  11/15: BP improved 144/88 this morning. Cr to 3.06. Renal US pending, glomerulonephropathy w/u pending   Consultants:  Nephrology   Procedures/Surgeries: none      ASSESSMENT & PLAN:   AKI (acute kidney injury) Proteinuria urine protein greater than 300 mg/dL  Lower extremity edema with hypoalbuminemia.  DDX: NSAID induced nephropathy (minimal-change and FSGS), other medical renal disease / glomerulonephropathy  avoid nephrotoxins follow BMPs renal ultrasound. Nephrology following  obtain urine protein to creatinine ratio. check ANCA antibodies, GBM antibodies, C3, C4, ANA, SPEP, UPEP, HIV screen.  may need to consider renal biopsy as well but this is not urgent at the moment.  Further plan as patient progresses.    Essential hypertension as needed IV labetalol and hydralazine. add scheduled p.o. Norvasc.   Hypokalemia Replace as needed Monitor BMP     Morbid obesity based on BMI: Body mass index is 56.58 kg/m. Complicates care/prognosis  Underweight - under 18.5  normal weight - 18.5 to 24.9 overweight - 25 to 29.9 obese - 30 or more   DVT prophylaxis: heparin sq IV fluids: NS 125 mL/h continuous IV fluids  Nutrition: cardiac/carb diet  Central lines / invasive devices: none  Code Status: FULL CODE ACP documentation reviewed: 01/28/23 and none on file in [facility]  TOC needs: none at this time  Barriers to dispo / significant pending items: renal function, further renal tests

## 2023-01-28 NOTE — Assessment & Plan Note (Signed)
-   We will replace potassium.

## 2023-01-28 NOTE — Telephone Encounter (Signed)
Reviewed   Discussed with clinical staff.   Patient admitted, will follow notes from hospital team   Pt will need hospital DC follow up with PCP   Ronnald Ramp, MD

## 2023-01-28 NOTE — Assessment & Plan Note (Signed)
-   We will hold off Hyzaar and place her on as needed IV labetalol and hydralazine. - Will add scheduled p.o. Norvasc.

## 2023-01-28 NOTE — Consult Note (Signed)
[facility] CONSULT NOTE    Date: 01/28/2023                  Patient Name:  Sierra Lee  MRN: [id number]  DOB: May 16, 1990  Age / Sex: 32 y.o., female         PCP: Debera Lat, PA-C                 Service Requesting Consult: Hospitalist                 Reason for Consult: Acute kidney injury/proteinuria            History of Present Illness: Patient is a 32 y.o. female with a PMHx of hypertension and prediabetes, who was admitted to [facility] on 01/27/2023 for evaluation of elevated creatinine.  Patient reports for the past several weeks she has been taking approximately 800 mg of ibuprofen for headache and both to take.  She has developed lower extremity edema over that time as well.  She states that the edema improves over the course of the day.  Urinalysis now shows protein greater 300 mg/dL.  In addition creatinine currently 3.06.  Baseline creatinine normal at 0.94 on 08/18/2022.  She currently denies nausea, vomiting, or dysgeusia.   Medications: Outpatient medications: (Not in a hospital admission)   Current medications: Current Facility-Administered Medications  Medication Dose Route Frequency Provider Last Rate Last Admin   acetaminophen (TYLENOL) tablet 650 mg  650 mg Oral Q6H PRN Mansy, Jan A, MD       Or   acetaminophen (TYLENOL) suppository 650 mg  650 mg Rectal Q6H PRN Mansy, Jan A, MD       amLODipine (NORVASC) tablet 10 mg  10 mg Oral Daily Mansy, Jan A, MD   10 mg at 01/28/23 0844   heparin injection 5,000 Units  5,000 Units Subcutaneous Q8H Sunnie Nielsen, DO       hydrALAZINE (APRESOLINE) injection 10 mg  10 mg Intravenous Q6H PRN Mansy, Jan A, MD       insulin aspart (novoLOG) injection 0-5 Units  0-5 Units Subcutaneous QHS Mansy, Jan A, MD       insulin aspart (novoLOG) injection 0-9 Units  0-9 Units Subcutaneous TID WC Mansy, Jan A, MD       labetalol (NORMODYNE) injection 20 mg  20 mg Intravenous Q3H PRN Mansy, Jan A, MD        magnesium hydroxide (MILK OF MAGNESIA) suspension 30 mL  30 mL Oral Daily PRN Mansy, Jan A, MD       ondansetron [facility]) tablet 4 mg  4 mg Oral Q6H PRN Mansy, Jan A, MD       Or   ondansetron [facility]) injection 4 mg  4 mg Intravenous Q6H PRN Mansy, Jan A, MD       traZODone (DESYREL) tablet 25 mg  25 mg Oral QHS PRN Mansy, Vernetta Honey, MD       Current Outpatient Medications  Medication Sig Dispense Refill   losartan-hydrochlorothiazide (HYZAAR) 50-12.5 MG tablet Take 1 tablet by mouth daily. 90 tablet 1   metFORMIN (GLUCOPHAGE) 500 MG tablet Take 1 tablet (500 mg total) by mouth daily with breakfast. (Patient not taking: Reported on 01/28/2023) 90 tablet 0      Allergies: Allergies  Allergen Reactions   Other Anaphylaxis, Hives and Rash    Pineapples   Penicillins Hives and Rash      Past Medical History: Past Medical History:  Diagnosis  Date   Allergy    Anemia    Hypertension      Past Surgical History: Past Surgical History:  Procedure Laterality Date   CESAREAN SECTION     CESAREAN SECTION WITH BILATERAL TUBAL LIGATION Bilateral 07/16/2019   Procedure: CESAREAN SECTION WITH BILATERAL TUBAL LIGATION;  Surgeon: Hildred Laser, MD;  Location: [facility] ORS;  Service: Obstetrics;  Laterality: Bilateral;  Repeat C-Section     Family History: Family History  Problem Relation Age of Onset   Diabetes Mother    Hypertension Mother    Breast cancer Maternal Aunt    Diabetes Paternal Aunt    Breast cancer Maternal Grandmother      Social History: Social History   Socioeconomic History   Marital status: Single    Spouse name: Not on file   Number of children: Not on file   Years of education: Not on file   Highest education level: GED or equivalent  Occupational History   Not on file  Tobacco Use   Smoking status: Former    Current packs/day: 0.00    Types: Cigarettes    Quit date: 10/12/2018    Years since quitting: 4.2   Smokeless tobacco: Never  Vaping Use   Vaping  status: Never Used  Substance and Sexual Activity   Alcohol use: Not Currently   Drug use: Never   Sexual activity: Not Currently    Partners: Male    Birth control/protection: Surgical    Comment: tubal ligation  Other Topics Concern   Not on file  Social History Narrative   Not on file   Social Determinants of Health   Financial Resource Strain: Low Risk  (01/25/2023)   Overall Financial Resource Strain (CARDIA)    Difficulty of Paying Living Expenses: Not very hard  Food Insecurity: Food Insecurity Present (01/28/2023)   Hunger Vital Sign    Worried About Running Out of Food in the Last Year: Sometimes true    Ran Out of Food in the Last Year: Never true  Transportation Needs: No Transportation Needs (01/28/2023)   PRAPARE - Administrator, Civil Service (Medical): No    Lack of Transportation (Non-Medical): No  Physical Activity: Insufficiently Active (01/25/2023)   Exercise Vital Sign    Days of Exercise per Week: 3 days    Minutes of Exercise per Session: 20 min  Stress: No Stress Concern Present (01/25/2023)   [employer] of Occupational Health - Occupational Stress Questionnaire    Feeling of Stress : Only a little  Social Connections: Socially Isolated (01/25/2023)   Social Connection and Isolation Panel [NHANES]    Frequency of Communication with Friends and Family: Once a week    Frequency of Social Gatherings with Friends and Family: Once a week    Attends Religious Services: Never    Database administrator or Organizations: No    Attends Engineer, structural: Not on file    Marital Status: Never married  Intimate Partner Violence: Not At Risk (01/28/2023)   Humiliation, Afraid, Rape, and Kick questionnaire    Fear of Current or Ex-Partner: No    Emotionally Abused: No    Physically Abused: No    Sexually Abused: No     Review of Systems: Review of Systems  Constitutional:  Negative for chills, fever and malaise/fatigue.   HENT:  Negative for congestion, hearing loss and tinnitus.   Eyes:  Negative for blurred vision and double vision.  Respiratory:  Negative for  cough, sputum production and shortness of breath.   Cardiovascular:  Positive for leg swelling. Negative for chest pain, palpitations and orthopnea.  Gastrointestinal:  Negative for diarrhea, nausea and vomiting.  Genitourinary:  Negative for dysuria, frequency and urgency.  Musculoskeletal:  Negative for myalgias.  Skin:  Negative for itching and rash.  Neurological:  Positive for headaches. Negative for dizziness and focal weakness.  Endo/Heme/Allergies:  Negative for polydipsia. Does not bruise/bleed easily.  Psychiatric/Behavioral:  The patient is not nervous/anxious.      Vital Signs: Blood pressure (!) 142/86, pulse 85, temperature 98.7 F (37.1 C), temperature source Oral, resp. rate 18, height 5\' 5"  (1.651 m), weight (!) 154.2 kg, last menstrual period 01/26/2023, SpO2 100%, not currently breastfeeding.  Weight trends: Filed Weights   01/27/23 1550  Weight: (!) 154.2 kg     Physical Exam: General: No acute distress  Head: Normocephalic, atraumatic. Moist oral mucosal membranes  Eyes: Anicteric  Neck: Supple  Lungs:  Clear to auscultation, normal effort  Heart: S1S2 no rubs  Abdomen:  Soft, nontender, bowel sounds present  Extremities: 2+ bilateral lower extremity edema  Neurologic: Awake, alert, following commands  Skin: No acute rash  Access: No hemodialysis access    Lab results: Basic Metabolic Panel: Recent Labs  Lab 01/26/23 1043 01/27/23 1557 01/28/23 0710  NA 140 138 138  K 3.7 3.3* 3.1*  CL 109* 109 111  CO2 20 22 19*  GLUCOSE 90 101* 102*  BUN 20 29* 25*  CREATININE 3.09* 3.33* 3.06*  CALCIUM 7.1* 7.1* 7.2*    Liver Function Tests: Recent Labs  Lab 01/26/23 1043 01/27/23 1557  AST 13 15  ALT 7 9  ALKPHOS 106 79  BILITOT <0.2 0.4  PROT 4.7* 5.7*  ALBUMIN 2.0* 1.7*   No results for input(s):  "LIPASE", "AMYLASE" in the last 168 hours. No results for input(s): "AMMONIA" in the last 168 hours.  CBC: Recent Labs  Lab 01/26/23 1043 01/27/23 1557 01/28/23 0424  WBC 11.2* 11.5* 11.4*  NEUTROABS  --  7.4  --   HGB 11.9 11.6* 10.7*  HCT 36.6 36.1 33.3*  MCV 87 87.6 86.7  PLT 374 354 333    Cardiac Enzymes: No results for input(s): "CKTOTAL", "CKMB", "CKMBINDEX", "TROPONINI" in the last 168 hours.  BNP: Invalid input(s): "POCBNP"  CBG: Recent Labs  Lab 01/28/23 0757  GLUCAP 105*    Microbiology: Results for orders placed or performed during the hospital encounter of 07/11/19  Respiratory Panel by RT PCR (Flu A&B, Covid) - Nasopharyngeal Swab     Status: None   Collection Time: 07/11/19 11:30 AM   Specimen: Nasopharyngeal Swab  Result Value Ref Range Status   SARS Coronavirus 2 by RT PCR NEGATIVE NEGATIVE Final    Comment: (NOTE) SARS-CoV-2 target nucleic acids are NOT DETECTED. The SARS-CoV-2 RNA is generally detectable in upper respiratoy specimens during the acute phase of infection. The lowest concentration of SARS-CoV-2 viral copies this assay can detect is 131 copies/mL. A negative result does not preclude SARS-Cov-2 infection and should not be used as the sole basis for treatment or other patient management decisions. A negative result may occur with  improper specimen collection/handling, submission of specimen other than nasopharyngeal swab, presence of viral mutation(s) within the areas targeted by this assay, and inadequate number of viral copies (<131 copies/mL). A negative result must be combined with clinical observations, patient history, and epidemiological information. The expected result is Negative. Fact Sheet for Patients:  https://www.moore.com/ Fact Sheet  for Healthcare Providers:  https://www.young.biz/ This test is not yet ap proved or cleared by the [place] and  has been authorized for  detection and/or diagnosis of SARS-CoV-2 by FDA under an Emergency Use Authorization (EUA). This EUA will remain  in effect (meaning this test can be used) for the duration of the COVID-19 declaration under Section 564(b)(1) of the Act, 21 U.S.C. section 360bbb-3(b)(1), unless the authorization is terminated or revoked sooner.    Influenza A by PCR NEGATIVE NEGATIVE Final   Influenza B by PCR NEGATIVE NEGATIVE Final    Comment: (NOTE) The Xpert Xpress SARS-CoV-2/FLU/RSV assay is intended as an aid in  the diagnosis of influenza from Nasopharyngeal swab specimens and  should not be used as a sole basis for treatment. Nasal washings and  aspirates are unacceptable for Xpert Xpress SARS-CoV-2/FLU/RSV  testing. Fact Sheet for Patients: https://www.moore.com/ Fact Sheet for Healthcare Providers: https://www.young.biz/ This test is not yet approved or cleared by the [place] FDA and  has been authorized for detection and/or diagnosis of SARS-CoV-2 by  FDA under an Emergency Use Authorization (EUA). This EUA will remain  in effect (meaning this test can be used) for the duration of the  Covid-19 declaration under Section 564(b)(1) of the Act, 21  U.S.C. section 360bbb-3(b)(1), unless the authorization is  terminated or revoked. Performed at [facility], [address] [place], [place] [address]     Coagulation Studies: No results for input(s): "LABPROT", "INR" in the last 72 hours.  Urinalysis: Recent Labs    01/28/23 1029  COLORURINE STRAW*  LABSPEC 1.011  PHURINE 7.0  GLUCOSEU 150*  HGBUR SMALL*  BILIRUBINUR NEGATIVE  KETONESUR NEGATIVE  PROTEINUR >=300*  NITRITE NEGATIVE  LEUKOCYTESUR NEGATIVE      Imaging: No results found.   Assessment & Plan: Pt is a 32 y.o. female with a PMHx of hypertension and prediabetes, who was admitted to [facility] on 01/27/2023 for evaluation of elevated creatinine.   1.  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 2.  Proteinuria urine protein greater than 300 mg/dL. 3.  Lower extremity edema with hypoalbuminemia.  Plan: Patient has developed acute kidney injury along with proteinuria.  She has been taking ibuprofen 800 mg sometimes multiple times in a day for headache and toothache.  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  We have recommended that she no longer take any NSAIDs.  We will obtain urine protein to creatinine ratio.  Will also check ANCA antibodies, GBM antibodies, C3, C4, ANA, SPEP, UPEP, HIV screen.  We may need to consider renal biopsy as well but this is not urgent at the moment.  Further plan as patient progresses.

## 2023-01-28 NOTE — Plan of Care (Addendum)
Dr notified of cbg 3 - will address when able. Since patient records indicate pre-DM, this CBG reading did not make sense.  Ordered lab draw to confirm (CBG result was 98).  No further insulin or treatments needed.

## 2023-02-02 ENCOUNTER — Encounter: Payer: Self-pay | Admitting: Family Medicine

## 2023-02-02 ENCOUNTER — Encounter: Payer: Self-pay | Admitting: Physician Assistant

## 2023-02-02 ENCOUNTER — Ambulatory Visit: Payer: Medicaid Other | Admitting: Family Medicine

## 2023-02-02 VITALS — BP 142/99 | HR 98 | Resp 16 | Wt 351.9 lb

## 2023-02-02 DIAGNOSIS — N179 Acute kidney failure, unspecified: Secondary | ICD-10-CM | POA: Diagnosis not present

## 2023-02-02 DIAGNOSIS — K219 Gastro-esophageal reflux disease without esophagitis: Secondary | ICD-10-CM | POA: Diagnosis not present

## 2023-02-02 DIAGNOSIS — I10 Essential (primary) hypertension: Secondary | ICD-10-CM | POA: Diagnosis not present

## 2023-02-02 DIAGNOSIS — Z6841 Body Mass Index (BMI) 40.0 and over, adult: Secondary | ICD-10-CM

## 2023-02-02 DIAGNOSIS — G4733 Obstructive sleep apnea (adult) (pediatric): Secondary | ICD-10-CM | POA: Insufficient documentation

## 2023-02-02 DIAGNOSIS — R5383 Other fatigue: Secondary | ICD-10-CM | POA: Diagnosis not present

## 2023-02-02 DIAGNOSIS — R0681 Apnea, not elsewhere classified: Secondary | ICD-10-CM | POA: Insufficient documentation

## 2023-02-02 DIAGNOSIS — R0683 Snoring: Secondary | ICD-10-CM

## 2023-02-02 MED ORDER — OMEPRAZOLE 20 MG PO CPDR: 20.0000 mg | DELAYED_RELEASE_CAPSULE | Freq: Every day | ORAL | 0 refills | Status: DC

## 2023-02-02 MED ORDER — BUPROPION HCL ER (XL) 150 MG PO TB24: 150.0000 mg | ORAL_TABLET | Freq: Every day | ORAL | 0 refills | Status: DC

## 2023-02-02 NOTE — Assessment & Plan Note (Signed)
Chronic, untreated Recommend trial of prilosec to assist F/u with PCP in 6 weeks

## 2023-02-02 NOTE — Assessment & Plan Note (Signed)
Chronic fatigue, exacerbated with m obesity Body mass index is 58.56 kg/m. Trial of wellbutrin to assist Reports witnessed gasping and apnea from children and children reports "she snores like a pig" Recommend sleep study testing for OSA vs OHS

## 2023-02-02 NOTE — Assessment & Plan Note (Signed)
Chronic, stable Continue to recommend balanced, lower carb meals. Smaller meal size, adding snacks. Choosing water as drink of choice and increasing purposeful exercise.  

## 2023-02-02 NOTE — Assessment & Plan Note (Signed)
Chronic, untreated Possible OSA contributing to mood, HTN and fatigue Recommend screening PSG

## 2023-02-02 NOTE — Progress Notes (Signed)
Established patient visit   Patient: Sierra Lee   DOB: 06/20/1990   32 y.o. Female  MRN: [id number] Visit Date: 02/02/2023  Today's healthcare provider: Jacky Kindle, FNP  Introduced to nurse practitioner role and practice setting.  All questions answered.  Discussed provider/patient relationship and expectations.  Chief Complaint  Patient presents with   Hospitalization Follow-up    Patient was admitted on 01/27/2023-01/28/2023 at [facility] for AKI.  Patient reports BP reading yesterday evening was 148/86.   Subjective    HPI HPI     Hospitalization Follow-up    Additional comments: Patient was admitted on 01/27/2023-01/28/2023 at [facility] for AKI.  Patient reports BP reading yesterday evening was 148/86.      Last edited by Marjie Skiff, CMA on 02/02/2023 10:56 AM.     The patient, known as Sierra Lee, presents for a follow-up visit after a recent hospitalization for acute kidney injury (AKI) and newly diagnosed hypertension. The AKI was suspected to be secondary to excessive ibuprofen use for a toothache and headache. The toothache has since been resolved with a tooth extraction, and the headache has improved with better blood pressure control. The patient reports significant improvement in lower extremity swelling since starting a combination of hydrochlorothiazide and amlodipine, although the hospital discontinued the diuretic due to concerns about kidney function. The patient has been proactive in managing her health, including walking four times a day and adhering to a diet, which has resulted in a weight loss of 2-3 pounds. She has been monitoring her blood pressure at home, with readings ranging from 135/80 to 148/83. The patient also reports a history of prediabetes, which is currently well-controlled with a diet, reflected in an HbA1c of 5.5. She has previously prescribed Saxenda for weight management, but it was not approved by her insurance.  The patient also reports a history of depression, for which she was previously medicated in 2019, and she expresses a desire to restart treatment. She also reports symptoms suggestive of sleep apnea, including snoring, daytime fatigue, and episodes of gasping for breath during sleep, as reported by her daughter. The patient also reports symptoms of gastroesophageal reflux disease (GERD), including acid reflux and regurgitation, particularly after drinking water or eating certain foods.  Medications: Outpatient Medications Prior to Visit  Medication Sig   acetaminophen (TYLENOL) 325 MG tablet Take 2 tablets (650 mg total) by mouth every 6 (six) hours as needed for mild pain (pain score 1-3) (or Fever >/= 101).   amLODipine (NORVASC) 10 MG tablet Take 1 tablet (10 mg total) by mouth daily.   No facility-administered medications prior to visit.   Review of Systems Last CBC Lab Results  Component Value Date   WBC 11.4 (H) 01/28/2023   HGB 10.7 (L) 01/28/2023   HCT 33.3 (L) 01/28/2023   MCV 86.7 01/28/2023   MCH 27.9 01/28/2023   RDW 13.5 01/28/2023   PLT 333 01/28/2023   Last metabolic panel Lab Results  Component Value Date   GLUCOSE 95 01/28/2023   NA 138 01/28/2023   K 3.1 (L) 01/28/2023   CL 111 01/28/2023   CO2 19 (L) 01/28/2023   BUN 25 (H) 01/28/2023   CREATININE 3.06 (H) 01/28/2023   GFRNONAA 20 (L) 01/28/2023   CALCIUM 7.2 (L) 01/28/2023   PROT 5.7 (L) 01/27/2023   ALBUMIN 1.7 (L) 01/27/2023   LABGLOB 2.7 01/26/2023   AGRATIO 1.0 (L) 08/18/2022   BILITOT 0.4 01/27/2023   ALKPHOS  79 01/27/2023   AST 15 01/27/2023   ALT 9 01/27/2023   ANIONGAP 8 01/28/2023   Last lipids Lab Results  Component Value Date   CHOL 125 08/18/2022   HDL 36 (L) 08/18/2022   LDLCALC 66 08/18/2022   TRIG 131 08/18/2022   CHOLHDL 3.5 08/18/2022   Last hemoglobin A1c Lab Results  Component Value Date   HGBA1C 5.5 01/26/2023   Last thyroid functions Lab Results  Component Value Date    TSH 2.290 01/26/2023     Objective    BP (!) 142/99 (BP Location: Left Arm, Patient Position: Sitting, Cuff Size: Large)   Pulse 98   Resp 16   Wt (!) 351 lb 14.4 oz (159.6 kg)   LMP 01/26/2023 (Exact Date) Comment: tubal ligation  SpO2 100%   BMI 58.56 kg/m   BP Readings from Last 3 Encounters:  02/02/23 (!) 142/99  01/28/23 (!) 132/95  01/26/23 (!) 180/132   Wt Readings from Last 3 Encounters:  02/02/23 (!) 351 lb 14.4 oz (159.6 kg)  01/27/23 (!) 340 lb (154.2 kg)  01/26/23 (!) 354 lb (160.6 kg)   SpO2 Readings from Last 3 Encounters:  02/02/23 100%  01/28/23 98%  01/26/23 98%   Physical Exam Vitals and nursing note reviewed.  Constitutional:      General: She is not in acute distress.    Appearance: Normal appearance. She is obese. She is not ill-appearing, toxic-appearing or diaphoretic.  HENT:     Head: Normocephalic and atraumatic.  Cardiovascular:     Rate and Rhythm: Normal rate and regular rhythm.     Pulses: Normal pulses.     Heart sounds: Normal heart sounds. No murmur heard.    No friction rub. No gallop.  Pulmonary:     Effort: Pulmonary effort is normal. No respiratory distress.     Breath sounds: Normal breath sounds. No stridor. No wheezing, rhonchi or rales.  Chest:     Chest wall: No tenderness.  Musculoskeletal:        General: No swelling, tenderness, deformity or signs of injury. Normal range of motion.     Right lower leg: No edema.     Left lower leg: No edema.  Skin:    General: Skin is warm and dry.     Capillary Refill: Capillary refill takes less than 2 seconds.     Coloration: Skin is not jaundiced or pale.     Findings: No bruising, erythema, lesion or rash.  Neurological:     General: No focal deficit present.     Mental Status: She is alert and oriented to person, place, and time. Mental status is at baseline.     Cranial Nerves: No cranial nerve deficit.     Sensory: No sensory deficit.     Motor: No weakness.      Coordination: Coordination normal.  Psychiatric:        Mood and Affect: Mood normal.        Behavior: Behavior normal.        Thought Content: Thought content normal.        Judgment: Judgment normal.     No results found for any visits on 02/02/23.  Assessment & Plan     Problem List Items Addressed This Visit   None Acute Kidney Injury (AKI) Likely secondary to NSAID use for toothache and headache. Kidney function improved since hospitalization. -Repeat labs today to ensure kidney function has stabilized.  Hypertension Improved since last visit, but  still elevated. Currently on Amlodipine. -Continue Amlodipine. -Check blood pressure at home and record readings. -Consider restarting Hydrochlorothiazide once kidney function is confirmed stable.  Edema Improved since last visit, but still present. Likely secondary to Amlodipine and recent discontinuation of Hydrochlorothiazide. -Continue monitoring. -Consider restarting Hydrochlorothiazide once kidney function is confirmed stable.  Depression History of depression, currently experiencing increased stress. -Start Wellbutrin. -Follow-up in early January to assess mood.  Gastroesophageal Reflux Disease (GERD) Reports symptoms of acid reflux. -Start Prilosec.  Obesity BMI of 58. Previous use of Saxenda, but insurance did not cover. -Attempt to get coverage for Saxenda. -Continue diet and exercise regimen.  Possible Sleep Apnea Reports snoring and daytime fatigue. -Consider further evaluation for sleep apnea in the future.     Leilani Merl, FNP, have reviewed all documentation for this visit. The documentation on 02/02/23 for the exam, diagnosis, procedures, and orders are all accurate and complete.  Jacky Kindle, FNP  [facility] [phone] (phone) [phone] (fax)  [facility]

## 2023-02-02 NOTE — Assessment & Plan Note (Signed)
Chronic, remains elevated Repeat BMP today to evaluate use of secondary antihypertensive Goal of 119/79 Continue norvasc 10 mg at this time Previously on combo hyzaar 50-12.5; since discontinued given AKI

## 2023-02-02 NOTE — Assessment & Plan Note (Signed)
Acute, unstable Last Creatinine at >3 Repeat labs at this time  Does not have nephro team

## 2023-02-03 ENCOUNTER — Telehealth: Payer: Self-pay | Admitting: Physician Assistant

## 2023-02-03 ENCOUNTER — Encounter: Payer: Self-pay | Admitting: Family Medicine

## 2023-02-03 ENCOUNTER — Other Ambulatory Visit: Payer: Self-pay | Admitting: Family Medicine

## 2023-02-03 ENCOUNTER — Other Ambulatory Visit: Payer: Self-pay

## 2023-02-03 DIAGNOSIS — N179 Acute kidney failure, unspecified: Secondary | ICD-10-CM

## 2023-02-03 LAB — CBC WITH DIFFERENTIAL/PLATELET
Basophils Absolute: 0.1 10*3/uL (ref 0.0–0.2)
Basos: 1 %
EOS (ABSOLUTE): 0.2 10*3/uL (ref 0.0–0.4)
Eos: 2 %
Hematocrit: 35.5 % (ref 34.0–46.6)
Hemoglobin: 11.6 g/dL (ref 11.1–15.9)
Immature Grans (Abs): 0 10*3/uL (ref 0.0–0.1)
Immature Granulocytes: 0 %
Lymphocytes Absolute: 2.9 10*3/uL (ref 0.7–3.1)
Lymphs: 26 %
MCH: 28 pg (ref 26.6–33.0)
MCHC: 32.7 g/dL (ref 31.5–35.7)
MCV: 86 fL (ref 79–97)
Monocytes Absolute: 0.5 10*3/uL (ref 0.1–0.9)
Monocytes: 4 %
Neutrophils Absolute: 7.4 10*3/uL — ABNORMAL HIGH (ref 1.4–7.0)
Neutrophils: 67 %
Platelets: 414 10*3/uL (ref 150–450)
RBC: 4.15 x10E6/uL (ref 3.77–5.28)
RDW: 13.1 % (ref 11.7–15.4)
WBC: 11.1 10*3/uL — ABNORMAL HIGH (ref 3.4–10.8)

## 2023-02-03 LAB — COMPREHENSIVE METABOLIC PANEL
ALT: 15 [IU]/L (ref 0–32)
AST: 16 [IU]/L (ref 0–40)
Albumin: 2.1 g/dL — ABNORMAL LOW (ref 4.0–5.0)
Alkaline Phosphatase: 110 [IU]/L (ref 44–121)
BUN/Creatinine Ratio: 7 — ABNORMAL LOW (ref 9–23)
BUN: 21 mg/dL — ABNORMAL HIGH (ref 6–20)
Bilirubin Total: <0.2 mg/dL (ref 0.0–1.2)
CO2: 17 mmol/L — ABNORMAL LOW (ref 20–29)
Calcium: 7.4 mg/dL — ABNORMAL LOW (ref 8.7–10.2)
Chloride: 109 mmol/L — ABNORMAL HIGH (ref 96–106)
Creatinine, Ser: 3.22 mg/dL — ABNORMAL HIGH (ref 0.57–1.00)
Globulin, Total: 2.7 g/dL (ref 1.5–4.5)
Glucose: 86 mg/dL (ref 70–99)
Potassium: 3.7 mmol/L (ref 3.5–5.2)
Sodium: 142 mmol/L (ref 134–144)
Total Protein: 4.8 g/dL — ABNORMAL LOW (ref 6.0–8.5)
eGFR: 19 mL/min/{1.73_m2} — ABNORMAL LOW (ref >59)

## 2023-02-03 NOTE — Progress Notes (Signed)
Significant Creatinine elevation remains; recommend continued water intake at goal of 40 oz/day and consult with kidney specialist as well as kidney ultrasound. Recommend repeat BMP in 1 week.

## 2023-02-04 ENCOUNTER — Other Ambulatory Visit: Payer: Self-pay | Admitting: Family Medicine

## 2023-02-04 ENCOUNTER — Telehealth: Payer: Self-pay

## 2023-02-04 MED ORDER — PEN NEEDLES 30G X 5 MM MISC: 1.0000 | Freq: Every day | 3 refills | Status: DC

## 2023-02-04 MED ORDER — SAXENDA 18 MG/3ML ~~LOC~~ SOPN: 0.6000 mg | PEN_INJECTOR | Freq: Every day | SUBCUTANEOUS | 0 refills | Status: DC

## 2023-02-04 NOTE — Telephone Encounter (Signed)
Requested medications are due for refill today.  no  Requested medications are on the active medications list.  yes  Last refill. 11/22/20242 3mL 0 rf  Future visit scheduled.   yes  Notes to clinic.  Pharmacy comment: Please clarify the directions  for this prescription. Is she stable at 0.6mg .  Typically weekly dose titratrion up to 1.8mg . thanks.    Requested Prescriptions  Pending Prescriptions Disp Refills   SAXENDA 18 MG/3ML SOPN [Pharmacy Med Name: SAXENDA 6MG /ML      INJ] 15 mL 0    Sig: INJECT 0.6MG  INTO THE SKIN DAILY     Endocrinology:  Diabetes - GLP-1 Receptor Agonists Passed - 02/04/2023  5:11 PM      Passed - HBA1C is between 0 and 7.9 and within 180 days    Hgb A1c MFr Bld  Date Value Ref Range Status  01/26/2023 5.5 4.8 - 5.6 % Final    Comment:             Prediabetes: 5.7 - 6.4          Diabetes: >6.4          Glycemic control for adults with diabetes: <7.0          Passed - Valid encounter within last 6 months    Recent Outpatient Visits           2 days ago Morbid obesity with BMI of 50.0-59.9, adult [facility])   [place] [facility] Jacky Kindle, FNP   1 week ago Primary hypertension   [place] [facility], [place], MD   1 month ago No-show for appointment   [facility], [place], PA-C   5 months ago Encounter to establish care   [facility] [place], [place], PA-C       Future Appointments             In 3 weeks [address], Edmon Crape, PA-C [facility]   In 1 month [place], [place], PA-C [facility]

## 2023-02-04 NOTE — Telephone Encounter (Signed)
..   Medicaid Managed Care   Unsuccessful Outreach Note  02/04/2023 Name: Sierra Lee MRN: [id number] DOB: 10-Sep-1990  Referred by: Debera Lat, PA-C Reason for referral : Appointment (I attempted to reach the patient today to get her scheduled with the MM Team. She did not answer but I left my name and number on her VM.)   An unsuccessful telephone outreach was attempted today. The patient was referred to the case management team for assistance with care management and care coordination.   Follow Up Plan: A HIPAA compliant phone message was left for the patient providing contact information and requesting a return call.  The care management team will reach out to the patient again over the next 7 days.   Weston Settle Care Guide  [facility] Managed  Care Guide [facility]  [phone]

## 2023-02-07 ENCOUNTER — Other Ambulatory Visit: Payer: Self-pay | Admitting: Family Medicine

## 2023-02-07 ENCOUNTER — Telehealth: Payer: Self-pay | Admitting: Physician Assistant

## 2023-02-07 ENCOUNTER — Ambulatory Visit: Payer: Medicaid Other

## 2023-02-07 ENCOUNTER — Telehealth: Payer: Self-pay

## 2023-02-07 MED ORDER — SAXENDA 18 MG/3ML ~~LOC~~ SOPN: PEN_INJECTOR | SUBCUTANEOUS | 0 refills | Status: DC

## 2023-02-07 NOTE — Telephone Encounter (Unsigned)
Copied from CRM [id number]. Topic: General - Other >> Feb 07, 2023 11:45 AM Everette C wrote: Reason for CRM: The patient has called for an update on their previously submitted prescription request for Liraglutide -Weight Management (SAXENDA) 18 MG/3ML  Please contact the patient further when possible

## 2023-02-07 NOTE — Telephone Encounter (Signed)
Patient called stated she spoke with pharmacy and they do not see where the script for Liraglutide -Weight Management (SAXENDA) 18 MG/3ML was approved and need nurse or provider to contact he pharmacy [id number] - [place] ([place]), [place] - [address].

## 2023-02-07 NOTE — Telephone Encounter (Signed)
..   Medicaid Managed Care   Unsuccessful Outreach Note  02/07/2023 Name: Sierra Lee MRN: [id number] DOB: April 14, 1990  Referred by: Debera Lat, PA-C Reason for referral : Appointment   A second unsuccessful telephone outreach was attempted today. The patient was referred to the case management team for assistance with care management and care coordination.   Follow Up Plan: A HIPAA compliant phone message was left for the patient providing contact information and requesting a return call.  The care management team will reach out to the patient again over the next 7 days.   Weston Settle Care Guide  [facility] Managed  Care Guide [facility]  [phone]

## 2023-02-07 NOTE — Telephone Encounter (Signed)
Pt is calling in checking on the status of her PA for Liraglutide -Weight Management (SAXENDA) 18 MG/3ML SOPN [[id number]] , please follow up with pt.

## 2023-02-07 NOTE — Telephone Encounter (Signed)
[facility] called and spoke to [place], [facility] about the refill(s) Saxenda requested. Advised it was sent on 02/07/23. She states she has already spoke with pt and sent request that PA is needed for medication. Advised I would forward to provider so PA can be started.

## 2023-02-08 ENCOUNTER — Telehealth: Payer: Self-pay | Admitting: Physician Assistant

## 2023-02-08 ENCOUNTER — Encounter: Payer: Self-pay | Admitting: Family Medicine

## 2023-02-08 NOTE — Telephone Encounter (Signed)
Received fax from Cover my meds for Saxenda 18mg /67ml pen injectors  [facility]

## 2023-02-08 NOTE — Telephone Encounter (Signed)
PA initiated-duplicate

## 2023-02-08 NOTE — Telephone Encounter (Signed)
PA initiated

## 2023-02-08 NOTE — Telephone Encounter (Signed)
Duplicate

## 2023-02-09 NOTE — Telephone Encounter (Signed)
Pt made aware via mychart rx was denied  Marjie Skiff, CMA  to [facility]     02/08/23  4:32 PM Message from Plan Denied. Per the health plan preferred drug list, at least 1 preferred drugs must be tried before requesting this drug or tell us why the member cannot try any preferred alternatives. Please send [place] supporting chart notes and lab results. Here is list of preferred alternatives: Wegovy. Note: Some preferred drug(s) may have quantity limits. Refer to the health plan's preferred drug list for additional details. Additionally, per the health plan guideline, GLP1s for Weight Management, the clinical criteria below must be met before this request can be approved. Please send [place] supporting chart notes and lab results. o The preferred drug, if applicable, which treats the PA indication, is required unless the patient meets the non-preferred PDL PA criteria, including completion of an adequate titration period of 3 to 6 months of the preferred drug. (Failure of the preferred drug is considered to be a drug trial and failure of 3 to 6 months to complete dose titration and determine the side effect profile for the member, unless there is a documented contraindication to the preferred drug. Titration can take up to 6 months for GLP1s.) o The beneficiary does NOT have any FDA-labeled contraindications to the requested agent, including pregnancy, lactation, history of medullary thyroid cancer or multiple endocrine neoplasia type II.   Please chek with your insurance which one are the preferred drugs.  Last read by Eulah Pont at  7:11 AM on 02/09/2023.

## 2023-02-14 ENCOUNTER — Ambulatory Visit (INDEPENDENT_AMBULATORY_CARE_PROVIDER_SITE_OTHER): Payer: Medicaid Other | Admitting: Physician Assistant

## 2023-02-14 DIAGNOSIS — N179 Acute kidney failure, unspecified: Secondary | ICD-10-CM | POA: Diagnosis not present

## 2023-02-14 DIAGNOSIS — Z91199 Patient's noncompliance with other medical treatment and regimen due to unspecified reason: Secondary | ICD-10-CM

## 2023-02-14 NOTE — Progress Notes (Unsigned)
  Established patient visit Patient was not seen for appt d/t no call, no show, or late arrival >10 mins past appt time.    Debera Lat PA [facility] [address] [place], [place] [address] [phone] (phone) [phone] (fax) [facility]

## 2023-02-15 LAB — BASIC METABOLIC PANEL
BUN/Creatinine Ratio: 7 — ABNORMAL LOW (ref 9–23)
BUN: 23 mg/dL — ABNORMAL HIGH (ref 6–20)
CO2: 19 mmol/L — ABNORMAL LOW (ref 20–29)
Calcium: 7.4 mg/dL — ABNORMAL LOW (ref 8.7–10.2)
Chloride: 105 mmol/L (ref 96–106)
Creatinine, Ser: 3.1 mg/dL — ABNORMAL HIGH (ref 0.57–1.00)
Glucose: 110 mg/dL — ABNORMAL HIGH (ref 70–99)
Potassium: 3.7 mmol/L (ref 3.5–5.2)
Sodium: 138 mmol/L (ref 134–144)
eGFR: 20 mL/min/{1.73_m2} — ABNORMAL LOW (ref >59)

## 2023-02-15 NOTE — Progress Notes (Signed)
Continued elevation in creatinine with low eGFR. Recommend an additional office visit.

## 2023-02-22 ENCOUNTER — Encounter: Payer: Self-pay | Admitting: Family Medicine

## 2023-02-22 ENCOUNTER — Ambulatory Visit: Payer: Medicaid Other | Admitting: Family Medicine

## 2023-02-22 VITALS — BP 140/102 | HR 97 | Resp 20 | Ht 65.0 in | Wt 351.0 lb

## 2023-02-22 DIAGNOSIS — N179 Acute kidney failure, unspecified: Secondary | ICD-10-CM | POA: Diagnosis not present

## 2023-02-22 DIAGNOSIS — I10 Essential (primary) hypertension: Secondary | ICD-10-CM

## 2023-02-22 MED ORDER — HYDRALAZINE HCL 10 MG PO TABS: 10.0000 mg | ORAL_TABLET | Freq: Two times a day (BID) | ORAL | 0 refills | Status: DC

## 2023-02-22 NOTE — Assessment & Plan Note (Signed)
Elevated blood pressure (160/114 in office, 132/78 at home). Currently on amlodipine 10 mg. Hypertension may be contributing to kidney injury. Discussed risks of hypertension including further kidney damage. Patient prefers to wait for nephrologist's input before starting new medication. - Recheck blood pressure after 5-6 minutes - Consider hydralazine 10 mg twice daily if needed - Advise taking amlodipine at least an hour before appointments

## 2023-02-22 NOTE — Assessment & Plan Note (Signed)
Acute kidney injury with elevated creatinine levels (3.10 on December 2nd, previously 3.22). Referred to nephrology. Potential etiologies include medication effects, dehydration, hypertension, autoimmune or infectious causes. Patient prefers blood work today despite upcoming nephrology appointment. - Measure urine albumin and creatinine levels - Order renal function panel - Follow up with nephrology on December 12th at 11 AM

## 2023-02-22 NOTE — Progress Notes (Signed)
Established patient visit   Patient: Sierra Lee   DOB: 07-12-1990   32 y.o. Female  MRN: [id number] Visit Date: 02/22/2023  Today's healthcare provider: Ronnald Ramp, MD   Chief Complaint  Patient presents with   Follow-up   Subjective       Discussed the use of AI scribe software for clinical note transcription with the patient, who gave verbal consent to proceed.  History of Present Illness   The patient is a 32 year old female with a recent history of acute kidney injury, presenting for follow-up. The patient's creatinine levels were elevated at 3.10 on December 2nd, indicating persistent kidney dysfunction despite cessation of NSAIDs. AKI was initially diagnosed on 01/26/23 w/ creatinine of 3.09, increased to 3.33 on 01/27/23 and has remained elevated since. Patient was treated for elevated blood pressure and reports normal BP at home ranging in the 120-130s/70s. Denies HA, vision changes today.    The patient's blood pressure was noted to be elevated at 160/114, although the patient reported lower readings at home. The patient denies any symptoms of headache or discomfort and reports an improvement in lower extremity swelling. The patient maintains an active lifestyle, walking a mile daily.  The patient reports frequent urination, even prior to starting a diuretic. The patient is currently on amlodipine 10mg  for blood pressure control. The patient has a history of urinary tract infections and bladder infections but denies any current symptoms suggestive of a urinary tract infection.  The patient has been denied weight loss medications by her insurance but is considering trying again once her kidney function improves. The patient is aware of the potential side effects of these medications, including constipation and diarrhea, which could potentially exacerbate her kidney issues.       Past Medical History:  Diagnosis Date   Allergy    Anemia     Hypertension     Medications: Outpatient Medications Prior to Visit  Medication Sig   acetaminophen (TYLENOL) 325 MG tablet Take 2 tablets (650 mg total) by mouth every 6 (six) hours as needed for mild pain (pain score 1-3) (or Fever >/= 101).   amLODipine (NORVASC) 10 MG tablet Take 1 tablet (10 mg total) by mouth daily.   buPROPion (WELLBUTRIN XL) 150 MG 24 hr tablet Take 1 tablet (150 mg total) by mouth daily.   Insulin Pen Needle (PEN NEEDLES) 30G X 5 MM MISC 1 each by Does not apply route daily.   omeprazole (PRILOSEC) 20 MG capsule Take 1 capsule (20 mg total) by mouth daily.   [DISCONTINUED] Liraglutide -Weight Management (SAXENDA) 18 MG/3ML SOPN Inject 0.6 mg into the skin daily at 12 noon for 7 days, THEN 1.2 mg daily at 12 noon for 7 days, THEN 1.8 mg daily at 12 noon.   No facility-administered medications prior to visit.    Review of Systems  Last CBC Lab Results  Component Value Date   WBC 11.1 (H) 02/02/2023   HGB 11.6 02/02/2023   HCT 35.5 02/02/2023   MCV 86 02/02/2023   MCH 28.0 02/02/2023   RDW 13.1 02/02/2023   PLT 414 02/02/2023   Last metabolic panel Lab Results  Component Value Date   GLUCOSE 110 (H) 02/14/2023   NA 138 02/14/2023   K 3.7 02/14/2023   CL 105 02/14/2023   CO2 19 (L) 02/14/2023   BUN 23 (H) 02/14/2023   CREATININE 3.10 (H) 02/14/2023   EGFR 20 (L) 02/14/2023   CALCIUM 7.4 (L) 02/14/2023  PROT 4.8 (L) 02/02/2023   ALBUMIN 2.1 (L) 02/02/2023   LABGLOB 2.7 02/02/2023   AGRATIO 1.0 (L) 08/18/2022   BILITOT <0.2 02/02/2023   ALKPHOS 110 02/02/2023   AST 16 02/02/2023   ALT 15 02/02/2023   ANIONGAP 8 01/28/2023   Last hemoglobin A1c Lab Results  Component Value Date   HGBA1C 5.5 01/26/2023        Objective    BP (!) 140/102   Pulse 97   Resp 20   Ht 5\' 5"  (1.651 m)   Wt (!) 351 lb (159.2 kg)   LMP 01/26/2023 (Exact Date) Comment: tubal ligation  SpO2 98%   BMI 58.41 kg/m   BP Readings from Last 3 Encounters:   02/22/23 (!) 140/102  02/02/23 (!) 142/99  01/28/23 (!) 132/95   Wt Readings from Last 3 Encounters:  02/22/23 (!) 351 lb (159.2 kg)  02/02/23 (!) 351 lb 14.4 oz (159.6 kg)  01/27/23 (!) 340 lb (154.2 kg)       Physical Exam  General: Alert, no acute distress Cardio: Normal S1 and S2, RRR, no r/m/g Pulm: CTAB, normal work of breathing Extremities: bilateral pedal edema, decreased from prior visit    No results found for any visits on 02/22/23.  Assessment & Plan     Problem List Items Addressed This Visit       Cardiovascular and Mediastinum   Primary hypertension    Elevated blood pressure (160/114 in office, 132/78 at home). Currently on amlodipine 10 mg. Hypertension may be contributing to kidney injury. Discussed risks of hypertension including further kidney damage. Patient prefers to wait for nephrologist's input before starting new medication. - Recheck blood pressure after 5-6 minutes - Consider hydralazine 10 mg twice daily if needed - Advise taking amlodipine at least an hour before appointments      Relevant Medications   hydrALAZINE (APRESOLINE) 10 MG tablet   Other Relevant Orders   Renal Function Panel     Genitourinary   AKI (acute kidney injury) (HCC) - Primary    Acute kidney injury with elevated creatinine levels (3.10 on December 2nd, previously 3.22). Referred to nephrology. Potential etiologies include medication effects, dehydration, hypertension, autoimmune or infectious causes. Patient prefers blood work today despite upcoming nephrology appointment. - Measure urine albumin and creatinine levels - Order renal function panel - Follow up with nephrology on December 12th at 11 AM      Relevant Orders   Microalbumin / creatinine urine ratio   Renal Function Panel        General Health Maintenance Discussed importance of avoiding NSAIDs and lifestyle modifications to manage blood pressure and overall health. Patient is currently exercising  regularly and monitoring for symptoms of UTI or other infections. - Avoid NSAIDs, including Advil, ibuprofen, Aleve, and naproxen - Continue current exercise routine, adjust for weather as needed - Monitor for symptoms of UTI or other infections -PCP updated per patient request   Return in about 4 weeks (around 03/22/2023) for aki f/u .         Ronnald Ramp, MD  [facility] [phone] (phone) [phone] (fax)  [facility]

## 2023-02-22 NOTE — Patient Instructions (Addendum)
[facility] 02/24/2023  11:00 AM Sierra Lee  VISIT SUMMARY:  Today, we reviewed your recent kidney function and blood pressure readings. Your creatinine levels remain elevated, indicating ongoing kidney issues. We discussed your current medications, lifestyle habits, and the importance of avoiding NSAIDs. We also talked about your upcoming nephrology appointment and the need for further blood work.  YOUR PLAN:  -ACUTE KIDNEY INJURY: Acute kidney injury means that your kidneys are not working as well as they should, which can be due to various reasons like medications, dehydration, or high blood pressure. We will measure your urine albumin and creatinine levels and order a renal function panel. Please follow up with nephrology on December 12th at 11 AM.  -HYPERTENSION: Hypertension, or high blood pressure, can damage your kidneys over time. Your blood pressure was high in the office but lower at home. We discussed the risks and decided to wait for the nephrologist's input before starting new medication. Please take amlodipine at least an hour before appointments. I have prescribed hydralazine 10mg  twice daily to help with blood pressure management as well given the elevated pressures today    INSTRUCTIONS:  Please follow up with nephrology on December 12th at 11 AM and with your primary care doctor in 4 weeks.

## 2023-02-23 ENCOUNTER — Ambulatory Visit: Payer: Medicaid Other | Attending: Otolaryngology

## 2023-02-23 DIAGNOSIS — I10 Essential (primary) hypertension: Secondary | ICD-10-CM | POA: Insufficient documentation

## 2023-02-23 DIAGNOSIS — K219 Gastro-esophageal reflux disease without esophagitis: Secondary | ICD-10-CM | POA: Insufficient documentation

## 2023-02-23 DIAGNOSIS — R0681 Apnea, not elsewhere classified: Secondary | ICD-10-CM | POA: Diagnosis not present

## 2023-02-23 DIAGNOSIS — R0683 Snoring: Secondary | ICD-10-CM | POA: Insufficient documentation

## 2023-02-23 DIAGNOSIS — Z6841 Body Mass Index (BMI) 40.0 and over, adult: Secondary | ICD-10-CM | POA: Diagnosis not present

## 2023-02-23 DIAGNOSIS — R5383 Other fatigue: Secondary | ICD-10-CM | POA: Insufficient documentation

## 2023-02-24 DIAGNOSIS — R809 Proteinuria, unspecified: Secondary | ICD-10-CM | POA: Diagnosis not present

## 2023-02-24 DIAGNOSIS — E1129 Type 2 diabetes mellitus with other diabetic kidney complication: Secondary | ICD-10-CM | POA: Diagnosis not present

## 2023-02-24 DIAGNOSIS — E876 Hypokalemia: Secondary | ICD-10-CM | POA: Diagnosis not present

## 2023-02-24 DIAGNOSIS — N179 Acute kidney failure, unspecified: Secondary | ICD-10-CM | POA: Diagnosis not present

## 2023-02-24 DIAGNOSIS — I10 Essential (primary) hypertension: Secondary | ICD-10-CM | POA: Diagnosis not present

## 2023-02-24 LAB — RENAL FUNCTION PANEL
Albumin: 2.1 g/dL — ABNORMAL LOW (ref 4.0–5.0)
BUN/Creatinine Ratio: 7 — ABNORMAL LOW (ref 9–23)
BUN: 23 mg/dL — ABNORMAL HIGH (ref 6–20)
CO2: 19 mmol/L — ABNORMAL LOW (ref 20–29)
Calcium: 7.5 mg/dL — ABNORMAL LOW (ref 8.7–10.2)
Chloride: 108 mmol/L — ABNORMAL HIGH (ref 96–106)
Creatinine, Ser: 3.42 mg/dL — ABNORMAL HIGH (ref 0.57–1.00)
Glucose: 80 mg/dL (ref 70–99)
Phosphorus: 5.3 mg/dL — ABNORMAL HIGH (ref 3.0–4.3)
Potassium: 4 mmol/L (ref 3.5–5.2)
Sodium: 140 mmol/L (ref 134–144)
eGFR: 18 mL/min/{1.73_m2} — ABNORMAL LOW (ref >59)

## 2023-02-24 LAB — MICROALBUMIN / CREATININE URINE RATIO
Creatinine, Urine: 68.8 mg/dL
Microalb/Creat Ratio: 9470 mg/g{creat} — ABNORMAL HIGH (ref 0–29)
Microalbumin, Urine: 6515.4 ug/mL

## 2023-02-28 ENCOUNTER — Telehealth (HOSPITAL_BASED_OUTPATIENT_CLINIC_OR_DEPARTMENT_OTHER): Payer: Medicaid Other | Admitting: Pulmonary Disease

## 2023-02-28 ENCOUNTER — Telehealth: Payer: Self-pay

## 2023-02-28 DIAGNOSIS — G4733 Obstructive sleep apnea (adult) (pediatric): Secondary | ICD-10-CM

## 2023-02-28 NOTE — Telephone Encounter (Signed)
Results have been routed to ordering provider.

## 2023-02-28 NOTE — Telephone Encounter (Signed)
Severe OSA corrected by bilevel 18/14 cm SUggest Rx for autoCPAP 10-20 cm & sleep consult

## 2023-02-28 NOTE — Telephone Encounter (Signed)
Copied from CRM [id number]. Topic: General - Inquiry >> Feb 28, 2023 11:09 AM De Blanch wrote: Reason for CRM: Pt stated she did a sleep study last week and is asking when she will be receiving her machine, as she hasn't been able to sleep.  Please advise.

## 2023-03-01 ENCOUNTER — Encounter: Payer: Self-pay | Admitting: Family Medicine

## 2023-03-01 MED ORDER — BLOOD PRESSURE MONITORING KIT: PACK | 0 refills | Status: DC

## 2023-03-01 NOTE — Telephone Encounter (Signed)
I can prescribe BP monitor electronically, patient may need written script to take to medical supply store   I have received study results and recommendations from pulmonary.   Recommend patient allow 7-14 business days for processing prior to receiving equipment

## 2023-03-01 NOTE — Telephone Encounter (Signed)
Pt calling to follow up on message from yesterday.   Pt inquiring of a blood pressure monitor can be prescribed as well. Pt declined any symptoms.   Pt requesting callback.

## 2023-03-02 ENCOUNTER — Other Ambulatory Visit: Payer: Self-pay

## 2023-03-02 DIAGNOSIS — G4733 Obstructive sleep apnea (adult) (pediatric): Secondary | ICD-10-CM

## 2023-03-02 NOTE — Telephone Encounter (Signed)
Responded to patient via separate MyChart encounter.

## 2023-03-03 ENCOUNTER — Ambulatory Visit: Payer: Medicaid Other | Admitting: Physician Assistant

## 2023-03-03 MED ORDER — BLOOD PRESSURE MONITORING KIT: PACK | 0 refills | Status: DC

## 2023-03-10 ENCOUNTER — Telehealth: Payer: Self-pay

## 2023-03-10 NOTE — Telephone Encounter (Signed)
Patient advised DME supplier will contact her as to how it gets to her.   Copied from CRM [id number]. Topic: General - Other >> Mar 10, 2023 10:11 AM Turkey B wrote: Reason for CRM: pt called in asking if her cpap machine is ready for pick up

## 2023-03-14 NOTE — Telephone Encounter (Signed)
Pt called reporting that she was contacted by the DME supplier and told that her PCP has not signed off on her CPAP machine.

## 2023-03-17 ENCOUNTER — Ambulatory Visit (INDEPENDENT_AMBULATORY_CARE_PROVIDER_SITE_OTHER): Payer: Medicaid Other | Admitting: Family Medicine

## 2023-03-17 ENCOUNTER — Encounter: Payer: Self-pay | Admitting: Family Medicine

## 2023-03-17 VITALS — BP 160/90 | HR 104 | Resp 16 | Ht 65.0 in | Wt 365.0 lb

## 2023-03-17 DIAGNOSIS — I10 Essential (primary) hypertension: Secondary | ICD-10-CM | POA: Diagnosis not present

## 2023-03-17 DIAGNOSIS — R0683 Snoring: Secondary | ICD-10-CM

## 2023-03-17 DIAGNOSIS — G4733 Obstructive sleep apnea (adult) (pediatric): Secondary | ICD-10-CM | POA: Diagnosis not present

## 2023-03-17 DIAGNOSIS — N184 Chronic kidney disease, stage 4 (severe): Secondary | ICD-10-CM | POA: Insufficient documentation

## 2023-03-17 MED ORDER — HYDRALAZINE HCL 25 MG PO TABS: 25.0000 mg | ORAL_TABLET | Freq: Three times a day (TID) | ORAL | 2 refills | Status: DC

## 2023-03-17 MED ORDER — BLOOD PRESSURE MONITORING KIT: PACK | 0 refills | Status: DC

## 2023-03-17 NOTE — Assessment & Plan Note (Signed)
 Chronic  Recent sleep study noting severe sleep apnea with AHI 102.3  Will order CPAP equipment with recommendation auto cpap 10-20cm H2O or Bipap 18/14  See telephone encounter for additional details  Patient to start CPAp once equipment available

## 2023-03-17 NOTE — Assessment & Plan Note (Signed)
 Severely elevated blood pressure at 171/110. Continues on hydralazine  10 mg BID and prescribed losartan  100 mg. Home readings as high as 140systolic when medication missed. Reports lack of sleep due to son's illness, possibly contributing to elevated readings. BP remains elevated at 160/90 on recheck in office today -Increase hydralazine  to 25 mg 3 times daily -Renal function panel ordered today -Patient will continue to follow-up with nephrology -Will follow-up in 2-3 weeks for blood pressure, if still elevated, recommend cardiology referral at that time - Continue losartan  100 mg and lasix  20mg  daily

## 2023-03-17 NOTE — Progress Notes (Signed)
 Established patient visit   Patient: Sierra Lee   DOB: September 04, 1990   32 y.o. Female  MRN: [id number] Visit Date: 03/17/2023  Today's healthcare provider: Rockie Agent, MD   Chief Complaint  Patient presents with   Mood   Subjective       Discussed the use of AI scribe software for clinical note transcription with the patient, who gave verbal consent to proceed.  History of Present Illness   The patient, a 33 year old female with a history of hypertension and acute kidney injury (AKI), presents for a follow-up visit. She has been evaluated by nephrology, with her kidney failure thought to be related to anasarca, proteinuria, and hypertension. There was a concern for nephrotic syndrome at her last nephrology appointment. She was advised to avoid NSAIDs and started on Lasix  20 mg daily.  She reports persistent swelling in her ankles, despite noticing an improvement in her arm and foot. She also mentions experiencing pitting edema in her legs. She has been taking Lasix  10 mg and hydralazine  10 mg twice daily.  The patient's blood pressure readings at home have been generally lower, with the highest recorded being 140. However, her blood pressure was significantly elevated during the current visit. She attributes this to lack of sleep due to her son being unwell.  She also reports a recent episode of severe leg pain that made it difficult for her to climb stairs. The pain was associated with fluid retention in her legs, which has since improved. She is scheduled for a follow-up with the kidney specialist in approximately two weeks.         Past Medical History:  Diagnosis Date   Allergy    Anemia    Hypertension     Medications: Outpatient Medications Prior to Visit  Medication Sig   acetaminophen  (TYLENOL ) 325 MG tablet Take 2 tablets (650 mg total) by mouth every 6 (six) hours as needed for mild pain (pain score 1-3) (or Fever >/= 101).   amLODipine   (NORVASC ) 10 MG tablet Take 1 tablet (10 mg total) by mouth daily.   calcitRIOL (ROCALTROL) 0.25 MCG capsule Take 0.25 mcg by mouth daily.   furosemide  (LASIX ) 20 MG tablet Take 20 mg by mouth daily. PRN   losartan  (COZAAR ) 100 MG tablet Take 100 mg by mouth daily.   omeprazole  (PRILOSEC) 20 MG capsule Take 1 capsule (20 mg total) by mouth daily.   [DISCONTINUED] Blood Pressure Monitoring KIT Use to measure blood pressure as needed for home monitoring   [DISCONTINUED] hydrALAZINE  (APRESOLINE ) 10 MG tablet Take 1 tablet (10 mg total) by mouth 2 (two) times daily.   No facility-administered medications prior to visit.    Review of Systems  Last metabolic panel Lab Results  Component Value Date   GLUCOSE 80 02/22/2023   NA 140 02/22/2023   K 4.0 02/22/2023   CL 108 (H) 02/22/2023   CO2 19 (L) 02/22/2023   BUN 23 (H) 02/22/2023   CREATININE 3.42 (H) 02/22/2023   EGFR 18 (L) 02/22/2023   CALCIUM 7.5 (L) 02/22/2023   PHOS 5.3 (H) 02/22/2023   PROT 4.8 (L) 02/02/2023   ALBUMIN 2.1 (L) 02/22/2023   LABGLOB 2.7 02/02/2023   AGRATIO 1.0 (L) 08/18/2022   BILITOT <0.2 02/02/2023   ALKPHOS 110 02/02/2023   AST 16 02/02/2023   ALT 15 02/02/2023   ANIONGAP 8 01/28/2023        Objective    BP (!) 160/90   Pulse (!)  104   Resp 16   Ht 5' 5 (1.651 m)   Wt (!) 365 lb (165.6 kg)   SpO2 98%   BMI 60.74 kg/m  BP Readings from Last 3 Encounters:  03/17/23 (!) 160/90  02/22/23 (!) 140/102  02/02/23 (!) 142/99   Wt Readings from Last 3 Encounters:  03/17/23 (!) 365 lb (165.6 kg)  02/22/23 (!) 351 lb (159.2 kg)  02/02/23 (!) 351 lb 14.4 oz (159.6 kg)        Physical Exam  Physical Exam   VITALS: BP- 171/110 CARDS:RRR  EXTREMITIES: 2+Pitting edema in bilateral LE      No results found for any visits on 03/17/23.  Assessment & Plan     Problem List Items Addressed This Visit       Cardiovascular and Mediastinum   Primary hypertension - Primary   Severely elevated  blood pressure at 171/110. Continues on hydralazine  10 mg BID and prescribed losartan  100 mg. Home readings as high as 140systolic when medication missed. Reports lack of sleep due to son's illness, possibly contributing to elevated readings. BP remains elevated at 160/90 on recheck in office today -Increase hydralazine  to 25 mg 3 times daily -Renal function panel ordered today -Patient will continue to follow-up with nephrology -Will follow-up in 2-3 weeks for blood pressure, if still elevated, recommend cardiology referral at that time - Continue losartan  100 mg and lasix  20mg  daily        Relevant Medications   losartan  (COZAAR ) 100 MG tablet   furosemide  (LASIX ) 20 MG tablet   hydrALAZINE  (APRESOLINE ) 25 MG tablet     Respiratory   Severe obstructive sleep apnea   Chronic  Recent sleep study noting severe sleep apnea with AHI 102.3  Will order CPAP equipment with recommendation auto cpap 10-20cm H2O or Bipap 18/14  See telephone encounter for additional details  Patient to start CPAp once equipment available         Genitourinary   CKD (chronic kidney disease) stage 4, GFR 15-29 ml/min (HCC)   Diagnosed with nephrotic syndrome, presenting with anasarca, proteinuria, and hypertension. Advised to avoid NSAIDs. Extensive lab work conducted including hepatitis B and C testing, HIV testing, ANA, ANCA antibody screening, phospholipase, glomerular basement membrane antibody, C3, C4 complement, CBC, kappa and lambda free light chains, magnesium  protein electrophoresis, and parathyroid hormone levels. Reports fluid retention and difficulty walking due to leg swelling. - Avoid NSAIDs - Continue current medications - Follow up with nephrology on March 29, 2023 -repeat RFP today  - Continue calcitriol 0.25 mcg - Continue Lasix  20 mg daily - Order renal panel       Relevant Orders   Renal Function Panel     Other   Snoring    General Health Maintenance Requires a Pap smear. -  Schedule Pap smear     Return in about 2 weeks (around 03/31/2023) for HTN.         Rockie Agent, MD  [facility] [phone] (phone) [phone] (fax)  [facility]

## 2023-03-17 NOTE — Assessment & Plan Note (Signed)
 Diagnosed with nephrotic syndrome, presenting with anasarca, proteinuria, and hypertension. Advised to avoid NSAIDs. Extensive lab work conducted including hepatitis B and C testing, HIV testing, ANA, ANCA antibody screening, phospholipase, glomerular basement membrane antibody, C3, C4 complement, CBC, kappa and lambda free light chains, magnesium  protein electrophoresis, and parathyroid hormone levels. Reports fluid retention and difficulty walking due to leg swelling. - Avoid NSAIDs - Continue current medications - Follow up with nephrology on March 29, 2023 -repeat RFP today  - Continue calcitriol 0.25 mcg - Continue Lasix  20 mg daily - Order renal panel

## 2023-03-17 NOTE — Telephone Encounter (Signed)
 Please initiate CPAP order with the following:  Severe obstructive sleep apnea  AHI 102.3 Oxygen nadir 89   Study review date 02/28/23  Auto CPAP 10-20 cm H2O arrange If not tolerated due to high-pressure, patient can use bilevel at 18/14 or start auto BiPAP  Use C-flex at level 3  Use small Fisher and PAYKEL fullface mask with humidity

## 2023-03-17 NOTE — Patient Instructions (Addendum)
 I have increased your hydralazine  to 25mg  three times daily to help with your blood pressure.   Please continue your other blood pressure medications as prescribed    I will notify you of the lab results once they are available    Please see me in 2-3 weeks for blood pressure follow up    I will reach out to our company to assist with the CPAP machine. Someone will call you with updates within the next week

## 2023-03-18 ENCOUNTER — Telehealth: Payer: Self-pay

## 2023-03-18 ENCOUNTER — Encounter: Payer: Self-pay | Admitting: Family Medicine

## 2023-03-18 DIAGNOSIS — N184 Chronic kidney disease, stage 4 (severe): Secondary | ICD-10-CM

## 2023-03-18 DIAGNOSIS — G4733 Obstructive sleep apnea (adult) (pediatric): Secondary | ICD-10-CM

## 2023-03-18 DIAGNOSIS — I10 Essential (primary) hypertension: Secondary | ICD-10-CM

## 2023-03-18 LAB — RENAL FUNCTION PANEL
Albumin: 1.8 g/dL — CL (ref 4.0–5.0)
BUN/Creatinine Ratio: 6 — ABNORMAL LOW (ref 9–23)
BUN: 30 mg/dL — ABNORMAL HIGH (ref 6–20)
CO2: 20 mmol/L (ref 20–29)
Calcium: 6.3 mg/dL — CL (ref 8.7–10.2)
Chloride: 109 mmol/L — ABNORMAL HIGH (ref 96–106)
Creatinine, Ser: 4.89 mg/dL — ABNORMAL HIGH (ref 0.57–1.00)
Glucose: 82 mg/dL (ref 70–99)
Phosphorus: 6.6 mg/dL — ABNORMAL HIGH (ref 3.0–4.3)
Potassium: 3.8 mmol/L (ref 3.5–5.2)
Sodium: 141 mmol/L (ref 134–144)
eGFR: 12 mL/min/{1.73_m2} — ABNORMAL LOW (ref >59)

## 2023-03-18 NOTE — Telephone Encounter (Signed)
 Patient's calcium corrects to 8.1 when taking her low albumin level (from proteinuria)  into consideration. This is stable compared to where she has been. Continue calcitriol and make sure to keep upcoming appt with nephrology.

## 2023-03-18 NOTE — Telephone Encounter (Signed)
 LVMTCB. CRM created. Ok for [facility] to advise/clarify.

## 2023-03-21 ENCOUNTER — Telehealth (HOSPITAL_BASED_OUTPATIENT_CLINIC_OR_DEPARTMENT_OTHER): Payer: Self-pay | Admitting: Pulmonary Disease

## 2023-03-21 NOTE — Telephone Encounter (Signed)
 Patient notified of lab comments from provider. Patient wants follow up on her sleep PAP machine- can someone look into that and let her know.

## 2023-03-25 ENCOUNTER — Other Ambulatory Visit: Payer: Self-pay

## 2023-03-25 DIAGNOSIS — G4733 Obstructive sleep apnea (adult) (pediatric): Secondary | ICD-10-CM

## 2023-03-25 DIAGNOSIS — R0683 Snoring: Secondary | ICD-10-CM

## 2023-03-25 NOTE — Telephone Encounter (Signed)
 Order originally placed 03/02/23. Following information below not included. Order has now been updated. Community message sent in regards to updated order

## 2023-03-28 NOTE — Telephone Encounter (Signed)
 Pt calling for update on CPAP machine.   Pt requesting callback, [phone]

## 2023-03-29 DIAGNOSIS — E1129 Type 2 diabetes mellitus with other diabetic kidney complication: Secondary | ICD-10-CM | POA: Diagnosis not present

## 2023-03-29 DIAGNOSIS — R809 Proteinuria, unspecified: Secondary | ICD-10-CM | POA: Diagnosis not present

## 2023-03-29 DIAGNOSIS — N179 Acute kidney failure, unspecified: Secondary | ICD-10-CM | POA: Diagnosis not present

## 2023-03-29 DIAGNOSIS — I10 Essential (primary) hypertension: Secondary | ICD-10-CM | POA: Diagnosis not present

## 2023-03-29 DIAGNOSIS — E876 Hypokalemia: Secondary | ICD-10-CM | POA: Diagnosis not present

## 2023-03-30 NOTE — Telephone Encounter (Signed)
 Per Osie Bleacher from DME team pt order is in scheduling review currently. Auth was just received on 03/29/23. Patient should expect a call for scheduling soon.   Pt advised. Verbalized understanding. Advised if she does not hear from anyone in 5-7 business days please let us  know. She verbalized understanding

## 2023-04-05 DIAGNOSIS — E1129 Type 2 diabetes mellitus with other diabetic kidney complication: Secondary | ICD-10-CM | POA: Diagnosis not present

## 2023-04-05 DIAGNOSIS — G4733 Obstructive sleep apnea (adult) (pediatric): Secondary | ICD-10-CM | POA: Diagnosis not present

## 2023-04-05 DIAGNOSIS — R809 Proteinuria, unspecified: Secondary | ICD-10-CM | POA: Diagnosis not present

## 2023-04-05 DIAGNOSIS — E876 Hypokalemia: Secondary | ICD-10-CM | POA: Diagnosis not present

## 2023-04-05 DIAGNOSIS — N179 Acute kidney failure, unspecified: Secondary | ICD-10-CM | POA: Diagnosis not present

## 2023-04-05 DIAGNOSIS — I10 Essential (primary) hypertension: Secondary | ICD-10-CM | POA: Diagnosis not present

## 2023-04-06 ENCOUNTER — Other Ambulatory Visit: Payer: Self-pay | Admitting: Nephrology

## 2023-04-06 DIAGNOSIS — N178 Other acute kidney failure: Secondary | ICD-10-CM

## 2023-04-06 DIAGNOSIS — R809 Proteinuria, unspecified: Secondary | ICD-10-CM

## 2023-04-06 DIAGNOSIS — E876 Hypokalemia: Secondary | ICD-10-CM

## 2023-04-06 DIAGNOSIS — H5213 Myopia, bilateral: Secondary | ICD-10-CM | POA: Diagnosis not present

## 2023-04-06 DIAGNOSIS — E1129 Type 2 diabetes mellitus with other diabetic kidney complication: Secondary | ICD-10-CM

## 2023-04-06 DIAGNOSIS — I10 Essential (primary) hypertension: Secondary | ICD-10-CM

## 2023-04-07 ENCOUNTER — Ambulatory Visit: Payer: Medicaid Other | Admitting: Family Medicine

## 2023-04-08 ENCOUNTER — Other Ambulatory Visit: Payer: Self-pay | Admitting: Family Medicine

## 2023-04-08 DIAGNOSIS — I10 Essential (primary) hypertension: Secondary | ICD-10-CM

## 2023-04-08 NOTE — Telephone Encounter (Signed)
Unable to refill per protocol, Rx expired. Discontinued 03/17/23, dose change.  Requested Prescriptions  Pending Prescriptions Disp Refills   hydrALAZINE (APRESOLINE) 10 MG tablet [Pharmacy Med Name: hydrALAZINE HCl 10 MG Oral Tablet] 60 tablet 0    Sig: Take 1 tablet by mouth twice daily     Cardiovascular:  Vasodilators Failed - 04/08/2023  2:53 PM      Failed - WBC in normal range and within 360 days    WBC  Date Value Ref Range Status  02/02/2023 11.1 (H) 3.4 - 10.8 x10E3/uL Final    Comment:    **Effective February 14, 2023 profile 005025 WBC will be made**   non-orderable as a stand-alone order code.   01/28/2023 11.4 (H) 4.0 - 10.5 K/uL Final         Failed - ANA Screen, Ifa, Serum in normal range and within 360 days    Antibody Screen  Date Value Ref Range Status  02/20/2019 Negative Negative Final         Failed - Last BP in normal range    BP Readings from Last 1 Encounters:  03/17/23 (!) 160/90         Passed - HCT in normal range and within 360 days    Hematocrit  Date Value Ref Range Status  02/02/2023 35.5 34.0 - 46.6 % Final         Passed - HGB in normal range and within 360 days    Hemoglobin  Date Value Ref Range Status  02/02/2023 11.6 11.1 - 15.9 g/dL Final         Passed - RBC in normal range and within 360 days    RBC  Date Value Ref Range Status  02/02/2023 4.15 3.77 - 5.28 x10E6/uL Final  01/28/2023 3.84 (L) 3.87 - 5.11 MIL/uL Final         Passed - PLT in normal range and within 360 days    Platelets  Date Value Ref Range Status  02/02/2023 414 150 - 450 x10E3/uL Final         Passed - Valid encounter within last 12 months    Recent Outpatient Visits           3 weeks ago Primary hypertension   [facility] Simmons-Robinson, [place], MD   1 month ago AKI (acute kidney injury) [facility])   [facility] Simmons-Robinson, [place], MD   1 month ago No-show for appointment   [facility] [place], [place], PA-C   2 months ago Morbid obesity with BMI of 50.0-59.9, adult [facility])   [facility] Merita Norton T, FNP   2 months ago Primary hypertension   [facility] Ronnald Ramp, MD

## 2023-04-11 DIAGNOSIS — R809 Proteinuria, unspecified: Secondary | ICD-10-CM | POA: Diagnosis not present

## 2023-04-11 DIAGNOSIS — E1129 Type 2 diabetes mellitus with other diabetic kidney complication: Secondary | ICD-10-CM | POA: Diagnosis not present

## 2023-04-11 DIAGNOSIS — N179 Acute kidney failure, unspecified: Secondary | ICD-10-CM | POA: Diagnosis not present

## 2023-04-11 DIAGNOSIS — I10 Essential (primary) hypertension: Secondary | ICD-10-CM | POA: Diagnosis not present

## 2023-04-11 DIAGNOSIS — E876 Hypokalemia: Secondary | ICD-10-CM | POA: Diagnosis not present

## 2023-04-12 ENCOUNTER — Other Ambulatory Visit (INDEPENDENT_AMBULATORY_CARE_PROVIDER_SITE_OTHER): Payer: Self-pay | Admitting: Nurse Practitioner

## 2023-04-12 DIAGNOSIS — N184 Chronic kidney disease, stage 4 (severe): Secondary | ICD-10-CM

## 2023-04-13 ENCOUNTER — Other Ambulatory Visit (INDEPENDENT_AMBULATORY_CARE_PROVIDER_SITE_OTHER): Payer: Self-pay

## 2023-04-13 ENCOUNTER — Encounter (INDEPENDENT_AMBULATORY_CARE_PROVIDER_SITE_OTHER): Payer: Self-pay

## 2023-04-14 ENCOUNTER — Telehealth (INDEPENDENT_AMBULATORY_CARE_PROVIDER_SITE_OTHER): Payer: Self-pay

## 2023-04-14 ENCOUNTER — Encounter (INDEPENDENT_AMBULATORY_CARE_PROVIDER_SITE_OTHER): Payer: Self-pay | Admitting: Nurse Practitioner

## 2023-04-14 ENCOUNTER — Ambulatory Visit (INDEPENDENT_AMBULATORY_CARE_PROVIDER_SITE_OTHER): Payer: Medicaid Other | Admitting: Nurse Practitioner

## 2023-04-14 ENCOUNTER — Ambulatory Visit (INDEPENDENT_AMBULATORY_CARE_PROVIDER_SITE_OTHER): Payer: Self-pay

## 2023-04-14 ENCOUNTER — Ambulatory Visit (INDEPENDENT_AMBULATORY_CARE_PROVIDER_SITE_OTHER): Payer: Medicaid Other

## 2023-04-14 VITALS — BP 195/133 | HR 97 | Resp 18 | Wt 360.0 lb

## 2023-04-14 DIAGNOSIS — N184 Chronic kidney disease, stage 4 (severe): Secondary | ICD-10-CM

## 2023-04-14 NOTE — H&P (View-Only) (Signed)
 Subjective:    Patient ID: Sierra Lee, female    DOB: Apr 29, 1990, 33 y.o.   MRN: [id number] Chief Complaint  Patient presents with   New Patient (Initial Visit)    Ref Kolluru consult vein map hemodialysis catheter placement     The patient is seen for evaluation for dialysis access. The patient has chronic renal insufficiency stage V secondary to hypertension. The patient's most recent creatinine clearance is less than 20.  The patient's volume status is now become an issue she is having uremic symptoms as well as uncontrolled hypertension.   The patient notes the kidney problem has been present for a long time and has been progressively getting worse.  The patient is followed by nephrology.    The patient is right-handed.  The patient has been considering the various methods of dialysis and wishes to proceed with hemodialysis and therefore creation of AV access is indicated.  No recent shortening of the patient's walking distance or new symptoms consistent with claudication.  No history of rest pain symptoms. No new ulcers or wounds of the lower extremities have occurred.  The patient denies amaurosis fugax or recent TIA symptoms. There are no recent neurological changes noted. There is no history of DVT, PE or superficial thrombophlebitis. No recent episodes of angina or shortness of breath documented.   Due to the patient's lab work we will have a PermCath placed urgently.  She also had vein mapping done today which shows adequate access for left brachiocephalic AV fistula.    Review of Systems  Constitutional:  Positive for fatigue.  Cardiovascular:  Positive for leg swelling.  All other systems reviewed and are negative.      Objective:   Physical Exam Vitals reviewed.  HENT:     Head: Normocephalic.  Cardiovascular:     Rate and Rhythm: Normal rate.     Pulses:          Radial pulses are 2+ on the right side and 2+ on the left side.  Pulmonary:     Effort:  Pulmonary effort is normal.  Skin:    General: Skin is warm and dry.  Neurological:     Mental Status: She is alert and oriented to person, place, and time.  Psychiatric:        Mood and Affect: Mood normal.        Behavior: Behavior normal.        Thought Content: Thought content normal.        Judgment: Judgment normal.     BP (!) 195/133   Pulse 97   Resp 18   Wt (!) 360 lb (163.3 kg)   BMI 59.91 kg/m   Past Medical History:  Diagnosis Date   Allergy    Anemia    Hypertension     Social History   Socioeconomic History   Marital status: Single    Spouse name: Not on file   Number of children: Not on file   Years of education: Not on file   Highest education level: GED or equivalent  Occupational History   Not on file  Tobacco Use   Smoking status: Former    Current packs/day: 0.00    Types: Cigarettes    Quit date: 10/12/2018    Years since quitting: 4.5   Smokeless tobacco: Never  Vaping Use   Vaping status: Never Used  Substance and Sexual Activity   Alcohol use: Not Currently   Drug use: Never   Sexual  activity: Not Currently    Partners: Male    Birth control/protection: Surgical    Comment: tubal ligation  Other Topics Concern   Not on file  Social History Narrative   Not on file   Social Drivers of Health   Financial Resource Strain: Low Risk  (03/14/2023)   Overall Financial Resource Strain (CARDIA)    Difficulty of Paying Living Expenses: Not very hard  Food Insecurity: Food Insecurity Present (03/14/2023)   Hunger Vital Sign    Worried About Running Out of Food in the Last Year: Sometimes true    Ran Out of Food in the Last Year: Sometimes true  Transportation Needs: No Transportation Needs (03/14/2023)   PRAPARE - Administrator, Civil Service (Medical): No    Lack of Transportation (Non-Medical): No  Physical Activity: Insufficiently Active (03/14/2023)   Exercise Vital Sign    Days of Exercise per Week: 3 days     Minutes of Exercise per Session: 30 min  Stress: No Stress Concern Present (03/14/2023)   [employer] of Occupational Health - Occupational Stress Questionnaire    Feeling of Stress : Not at all  Social Connections: Socially Isolated (03/14/2023)   Social Connection and Isolation Panel [NHANES]    Frequency of Communication with Friends and Family: Once a week    Frequency of Social Gatherings with Friends and Family: Once a week    Attends Religious Services: More than 4 times per year    Active Member of [employer] or Organizations: No    Attends Engineer, structural: Not on file    Marital Status: Never married  Intimate Partner Violence: Not At Risk (01/28/2023)   Humiliation, Afraid, Rape, and Kick questionnaire    Fear of Current or Ex-Partner: No    Emotionally Abused: No    Physically Abused: No    Sexually Abused: No    Past Surgical History:  Procedure Laterality Date   CESAREAN SECTION     CESAREAN SECTION WITH BILATERAL TUBAL LIGATION Bilateral 07/16/2019   Procedure: CESAREAN SECTION WITH BILATERAL TUBAL LIGATION;  Surgeon: Hildred Laser, MD;  Location: [facility] ORS;  Service: Obstetrics;  Laterality: Bilateral;  Repeat C-Section    Family History  Problem Relation Age of Onset   Diabetes Mother    Hypertension Mother    Breast cancer Maternal Aunt    Diabetes Paternal Aunt    Breast cancer Maternal Grandmother     Allergies  Allergen Reactions   Other Anaphylaxis, Hives and Rash    Pineapples   Penicillins Hives and Rash       Latest Ref Rng & Units 02/02/2023   11:26 AM 01/28/2023    4:24 AM 01/27/2023    3:57 PM  CBC  WBC 3.4 - 10.8 x10E3/uL 11.1  11.4  11.5   Hemoglobin 11.1 - 15.9 g/dL 10.2  72.5  36.6   Hematocrit 34.0 - 46.6 % 35.5  33.3  36.1   Platelets 150 - 450 x10E3/uL 414  333  354       CMP     Component Value Date/Time   NA 141 03/17/2023 1138   K 3.8 03/17/2023 1138   CL 109 (H) 03/17/2023 1138   CO2 20 03/17/2023 1138    GLUCOSE 82 03/17/2023 1138   GLUCOSE 95 01/28/2023 1241   BUN 30 (H) 03/17/2023 1138   CREATININE 4.89 (H) 03/17/2023 1138   CALCIUM 6.3 (LL) 03/17/2023 1138   PROT 4.8 (L) 02/02/2023 1126  ALBUMIN 1.8 (LL) 03/17/2023 1138   AST 16 02/02/2023 1126   ALT 15 02/02/2023 1126   ALKPHOS 110 02/02/2023 1126   BILITOT <0.2 02/02/2023 1126   EGFR 12 (L) 03/17/2023 1138   GFRNONAA 20 (L) 01/28/2023 0710     No results found.     Assessment & Plan:   1. CKD (chronic kidney disease) stage 4, GFR 15-29 ml/min (HCC) (Primary) The patient has been noted to have uremic symptoms and uncontrolled hypertension.  Based on her labs and her nephrologist recommendation, it would be best to begin hemodialysis urgently.  Will plan to have a PermCath placed for the patient.  We attempted to have it placed Friday but the patient noted that Monday would work better for her.  I have discussed the procedure, risk and benefit and the patient agrees to move forward with PermCath placement on Monday.  I have advised her that if she begins to have chest pain, shortness of breath, or headaches she should present to the emergency room.  The patient will also need permanent dialysis access placement.  We have discussed placement of a left upper extremity AV fistula.  Based on meantime we will plan on placement of a left brachiocephalic AV fistula.  We have discussed risk benefits alternatives and this will be scheduled in the next few weeks post catheter placement   Current Outpatient Medications on File Prior to Visit  Medication Sig Dispense Refill   acetaminophen (TYLENOL) 325 MG tablet Take 2 tablets (650 mg total) by mouth every 6 (six) hours as needed for mild pain (pain score 1-3) (or Fever >/= 101).     amLODipine (NORVASC) 10 MG tablet Take 1 tablet (10 mg total) by mouth daily. 30 tablet 0   Blood Pressure Monitoring KIT Use to measure blood pressure as needed for home monitoring 1 kit 0   calcitRIOL  (ROCALTROL) 0.25 MCG capsule Take 0.25 mcg by mouth daily.     cloNIDine (CATAPRES) 0.1 MG tablet Take 0.1 mg by mouth 2 (two) times daily.     furosemide (LASIX) 20 MG tablet Take 20 mg by mouth daily. PRN     hydrALAZINE (APRESOLINE) 25 MG tablet Take 1 tablet (25 mg total) by mouth 3 (three) times daily. 90 tablet 2   losartan (COZAAR) 100 MG tablet Take 100 mg by mouth daily.     Multiple Vitamin (MULTIVITAMIN) capsule Take 1 capsule by mouth daily.     omeprazole (PRILOSEC) 20 MG capsule Take 1 capsule (20 mg total) by mouth daily. 90 capsule 0   No current facility-administered medications on file prior to visit.    There are no Patient Instructions on file for this visit. No follow-ups on file.   Georgiana Spinner, NP

## 2023-04-14 NOTE — Progress Notes (Signed)
Subjective:    Patient ID: Sierra Lee, female    DOB: Apr 29, 1990, 33 y.o.   MRN: [id number] Chief Complaint  Patient presents with   New Patient (Initial Visit)    Ref Kolluru consult vein map hemodialysis catheter placement     The patient is seen for evaluation for dialysis access. The patient has chronic renal insufficiency stage V secondary to hypertension. The patient's most recent creatinine clearance is less than 20.  The patient's volume status is now become an issue she is having uremic symptoms as well as uncontrolled hypertension.   The patient notes the kidney problem has been present for a long time and has been progressively getting worse.  The patient is followed by nephrology.    The patient is right-handed.  The patient has been considering the various methods of dialysis and wishes to proceed with hemodialysis and therefore creation of AV access is indicated.  No recent shortening of the patient's walking distance or new symptoms consistent with claudication.  No history of rest pain symptoms. No new ulcers or wounds of the lower extremities have occurred.  The patient denies amaurosis fugax or recent TIA symptoms. There are no recent neurological changes noted. There is no history of DVT, PE or superficial thrombophlebitis. No recent episodes of angina or shortness of breath documented.   Due to the patient's lab work we will have a PermCath placed urgently.  She also had vein mapping done today which shows adequate access for left brachiocephalic AV fistula.    Review of Systems  Constitutional:  Positive for fatigue.  Cardiovascular:  Positive for leg swelling.  All other systems reviewed and are negative.      Objective:   Physical Exam Vitals reviewed.  HENT:     Head: Normocephalic.  Cardiovascular:     Rate and Rhythm: Normal rate.     Pulses:          Radial pulses are 2+ on the right side and 2+ on the left side.  Pulmonary:     Effort:  Pulmonary effort is normal.  Skin:    General: Skin is warm and dry.  Neurological:     Mental Status: She is alert and oriented to person, place, and time.  Psychiatric:        Mood and Affect: Mood normal.        Behavior: Behavior normal.        Thought Content: Thought content normal.        Judgment: Judgment normal.     BP (!) 195/133   Pulse 97   Resp 18   Wt (!) 360 lb (163.3 kg)   BMI 59.91 kg/m   Past Medical History:  Diagnosis Date   Allergy    Anemia    Hypertension     Social History   Socioeconomic History   Marital status: Single    Spouse name: Not on file   Number of children: Not on file   Years of education: Not on file   Highest education level: GED or equivalent  Occupational History   Not on file  Tobacco Use   Smoking status: Former    Current packs/day: 0.00    Types: Cigarettes    Quit date: 10/12/2018    Years since quitting: 4.5   Smokeless tobacco: Never  Vaping Use   Vaping status: Never Used  Substance and Sexual Activity   Alcohol use: Not Currently   Drug use: Never   Sexual  activity: Not Currently    Partners: Male    Birth control/protection: Surgical    Comment: tubal ligation  Other Topics Concern   Not on file  Social History Narrative   Not on file   Social Drivers of Health   Financial Resource Strain: Low Risk  (03/14/2023)   Overall Financial Resource Strain (CARDIA)    Difficulty of Paying Living Expenses: Not very hard  Food Insecurity: Food Insecurity Present (03/14/2023)   Hunger Vital Sign    Worried About Running Out of Food in the Last Year: Sometimes true    Ran Out of Food in the Last Year: Sometimes true  Transportation Needs: No Transportation Needs (03/14/2023)   PRAPARE - Administrator, Civil Service (Medical): No    Lack of Transportation (Non-Medical): No  Physical Activity: Insufficiently Active (03/14/2023)   Exercise Vital Sign    Days of Exercise per Week: 3 days     Minutes of Exercise per Session: 30 min  Stress: No Stress Concern Present (03/14/2023)   [employer] of Occupational Health - Occupational Stress Questionnaire    Feeling of Stress : Not at all  Social Connections: Socially Isolated (03/14/2023)   Social Connection and Isolation Panel [NHANES]    Frequency of Communication with Friends and Family: Once a week    Frequency of Social Gatherings with Friends and Family: Once a week    Attends Religious Services: More than 4 times per year    Active Member of [employer] or Organizations: No    Attends Engineer, structural: Not on file    Marital Status: Never married  Intimate Partner Violence: Not At Risk (01/28/2023)   Humiliation, Afraid, Rape, and Kick questionnaire    Fear of Current or Ex-Partner: No    Emotionally Abused: No    Physically Abused: No    Sexually Abused: No    Past Surgical History:  Procedure Laterality Date   CESAREAN SECTION     CESAREAN SECTION WITH BILATERAL TUBAL LIGATION Bilateral 07/16/2019   Procedure: CESAREAN SECTION WITH BILATERAL TUBAL LIGATION;  Surgeon: Hildred Laser, MD;  Location: [facility] ORS;  Service: Obstetrics;  Laterality: Bilateral;  Repeat C-Section    Family History  Problem Relation Age of Onset   Diabetes Mother    Hypertension Mother    Breast cancer Maternal Aunt    Diabetes Paternal Aunt    Breast cancer Maternal Grandmother     Allergies  Allergen Reactions   Other Anaphylaxis, Hives and Rash    Pineapples   Penicillins Hives and Rash       Latest Ref Rng & Units 02/02/2023   11:26 AM 01/28/2023    4:24 AM 01/27/2023    3:57 PM  CBC  WBC 3.4 - 10.8 x10E3/uL 11.1  11.4  11.5   Hemoglobin 11.1 - 15.9 g/dL 10.2  72.5  36.6   Hematocrit 34.0 - 46.6 % 35.5  33.3  36.1   Platelets 150 - 450 x10E3/uL 414  333  354       CMP     Component Value Date/Time   NA 141 03/17/2023 1138   K 3.8 03/17/2023 1138   CL 109 (H) 03/17/2023 1138   CO2 20 03/17/2023 1138    GLUCOSE 82 03/17/2023 1138   GLUCOSE 95 01/28/2023 1241   BUN 30 (H) 03/17/2023 1138   CREATININE 4.89 (H) 03/17/2023 1138   CALCIUM 6.3 (LL) 03/17/2023 1138   PROT 4.8 (L) 02/02/2023 1126  ALBUMIN 1.8 (LL) 03/17/2023 1138   AST 16 02/02/2023 1126   ALT 15 02/02/2023 1126   ALKPHOS 110 02/02/2023 1126   BILITOT <0.2 02/02/2023 1126   EGFR 12 (L) 03/17/2023 1138   GFRNONAA 20 (L) 01/28/2023 0710     No results found.     Assessment & Plan:   1. CKD (chronic kidney disease) stage 4, GFR 15-29 ml/min (HCC) (Primary) The patient has been noted to have uremic symptoms and uncontrolled hypertension.  Based on her labs and her nephrologist recommendation, it would be best to begin hemodialysis urgently.  Will plan to have a PermCath placed for the patient.  We attempted to have it placed Friday but the patient noted that Monday would work better for her.  I have discussed the procedure, risk and benefit and the patient agrees to move forward with PermCath placement on Monday.  I have advised her that if she begins to have chest pain, shortness of breath, or headaches she should present to the emergency room.  The patient will also need permanent dialysis access placement.  We have discussed placement of a left upper extremity AV fistula.  Based on meantime we will plan on placement of a left brachiocephalic AV fistula.  We have discussed risk benefits alternatives and this will be scheduled in the next few weeks post catheter placement   Current Outpatient Medications on File Prior to Visit  Medication Sig Dispense Refill   acetaminophen (TYLENOL) 325 MG tablet Take 2 tablets (650 mg total) by mouth every 6 (six) hours as needed for mild pain (pain score 1-3) (or Fever >/= 101).     amLODipine (NORVASC) 10 MG tablet Take 1 tablet (10 mg total) by mouth daily. 30 tablet 0   Blood Pressure Monitoring KIT Use to measure blood pressure as needed for home monitoring 1 kit 0   calcitRIOL  (ROCALTROL) 0.25 MCG capsule Take 0.25 mcg by mouth daily.     cloNIDine (CATAPRES) 0.1 MG tablet Take 0.1 mg by mouth 2 (two) times daily.     furosemide (LASIX) 20 MG tablet Take 20 mg by mouth daily. PRN     hydrALAZINE (APRESOLINE) 25 MG tablet Take 1 tablet (25 mg total) by mouth 3 (three) times daily. 90 tablet 2   losartan (COZAAR) 100 MG tablet Take 100 mg by mouth daily.     Multiple Vitamin (MULTIVITAMIN) capsule Take 1 capsule by mouth daily.     omeprazole (PRILOSEC) 20 MG capsule Take 1 capsule (20 mg total) by mouth daily. 90 capsule 0   No current facility-administered medications on file prior to visit.    There are no Patient Instructions on file for this visit. No follow-ups on file.   Georgiana Spinner, NP

## 2023-04-14 NOTE — Telephone Encounter (Signed)
I attempted to contact the patient to schedule a permcath placement with Dr. Wyn Quaker on 04/18/23. A message was left for a return call.

## 2023-04-15 ENCOUNTER — Other Ambulatory Visit: Payer: Self-pay | Admitting: Family Medicine

## 2023-04-15 ENCOUNTER — Encounter: Payer: Self-pay | Admitting: Family Medicine

## 2023-04-15 DIAGNOSIS — N184 Chronic kidney disease, stage 4 (severe): Secondary | ICD-10-CM

## 2023-04-15 DIAGNOSIS — I10 Essential (primary) hypertension: Secondary | ICD-10-CM

## 2023-04-15 NOTE — Telephone Encounter (Signed)
Spoke with the patient and let her know about her upcoming procedure with Dr. Wyn Quaker on 04/18/23 at the [facility]. Pre-procedure instructions were discussed and sent to Mychart.

## 2023-04-15 NOTE — Progress Notes (Signed)
Patient requests second opinion referral with [facility] nephrology. Currently recommended for HD due to worsening kidney function and uremia and severe HTN   Referral submitted   Refer to OV notes from 03/17/23 and 02/22/23

## 2023-04-16 ENCOUNTER — Other Ambulatory Visit (INDEPENDENT_AMBULATORY_CARE_PROVIDER_SITE_OTHER): Payer: Self-pay | Admitting: Nurse Practitioner

## 2023-04-16 DIAGNOSIS — N184 Chronic kidney disease, stage 4 (severe): Secondary | ICD-10-CM

## 2023-04-18 ENCOUNTER — Encounter
Admission: RE | Disposition: A | Discharge status: Home / Self Care | Payer: Self-pay | Source: Ambulatory Visit | Attending: Vascular Surgery

## 2023-04-18 ENCOUNTER — Other Ambulatory Visit: Payer: Self-pay

## 2023-04-18 ENCOUNTER — Telehealth (INDEPENDENT_AMBULATORY_CARE_PROVIDER_SITE_OTHER): Payer: Self-pay | Admitting: Nurse Practitioner

## 2023-04-18 ENCOUNTER — Ambulatory Visit
Admission: RE | Admit: 2023-04-18 | Discharge: 2023-04-18 | Disposition: A | Discharge status: Home / Self Care | Payer: Medicaid Other | Source: Ambulatory Visit | Attending: Vascular Surgery | Admitting: Vascular Surgery

## 2023-04-18 ENCOUNTER — Encounter: Payer: Self-pay | Admitting: Vascular Surgery

## 2023-04-18 DIAGNOSIS — Z992 Dependence on renal dialysis: Secondary | ICD-10-CM | POA: Insufficient documentation

## 2023-04-18 DIAGNOSIS — I120 Hypertensive chronic kidney disease with stage 5 chronic kidney disease or end stage renal disease: Secondary | ICD-10-CM | POA: Insufficient documentation

## 2023-04-18 DIAGNOSIS — N186 End stage renal disease: Secondary | ICD-10-CM | POA: Diagnosis not present

## 2023-04-18 DIAGNOSIS — Z87891 Personal history of nicotine dependence: Secondary | ICD-10-CM | POA: Insufficient documentation

## 2023-04-18 DIAGNOSIS — N184 Chronic kidney disease, stage 4 (severe): Secondary | ICD-10-CM

## 2023-04-18 HISTORY — PX: DIALYSIS/PERMA CATHETER INSERTION: CATH118288

## 2023-04-18 LAB — POTASSIUM (ARMC VASCULAR LAB ONLY): Potassium (ARMC vascular lab): 4.6 mmol/L (ref 3.5–5.1)

## 2023-04-18 SURGERY — DIALYSIS/PERMA CATHETER INSERTION
Anesthesia: Moderate Sedation

## 2023-04-18 MED ORDER — ONDANSETRON HCL 4 MG/2ML IJ SOLN
4.0000 mg | Freq: Four times a day (QID) | INTRAMUSCULAR | Status: DC | PRN
  Administered 2023-04-18: 4 mg via INTRAVENOUS

## 2023-04-18 MED ORDER — DIPHENHYDRAMINE HCL 50 MG/ML IJ SOLN: 50.0000 mg | Freq: Once | INTRAMUSCULAR | Status: DC | PRN

## 2023-04-18 MED ORDER — FENTANYL CITRATE (PF) 100 MCG/2ML IJ SOLN
INTRAMUSCULAR | Status: AC
  Filled 2023-04-18: qty 2

## 2023-04-18 MED ORDER — VANCOMYCIN HCL 1500 MG/300ML IV SOLN
1500.0000 mg | INTRAVENOUS | Status: AC
  Administered 2023-04-18: 1500 mg via INTRAVENOUS
  Filled 2023-04-18: qty 300

## 2023-04-18 MED ORDER — HEPARIN SODIUM (PORCINE) 10000 UNIT/ML IJ SOLN
INTRAMUSCULAR | Status: DC | PRN
  Administered 2023-04-18: 10000 [IU]

## 2023-04-18 MED ORDER — SODIUM CHLORIDE 0.9 % IV SOLN: INTRAVENOUS | Status: DC

## 2023-04-18 MED ORDER — MIDAZOLAM HCL 2 MG/ML PO SYRP
ORAL_SOLUTION | ORAL | Status: AC
  Administered 2023-04-18: 8 mg via ORAL
  Filled 2023-04-18: qty 5

## 2023-04-18 MED ORDER — LIDOCAINE-EPINEPHRINE (PF) 1 %-1:200000 IJ SOLN
INTRAMUSCULAR | Status: DC | PRN
  Administered 2023-04-18: 20 mL via INTRADERMAL
  Administered 2023-04-18: 5 mL via INTRADERMAL

## 2023-04-18 MED ORDER — FAMOTIDINE 20 MG PO TABS: 40.0000 mg | ORAL_TABLET | Freq: Once | ORAL | Status: DC | PRN

## 2023-04-18 MED ORDER — MIDAZOLAM HCL 2 MG/2ML IJ SOLN
INTRAMUSCULAR | Status: DC | PRN
  Administered 2023-04-18: 2 mg via INTRAVENOUS

## 2023-04-18 MED ORDER — HEPARIN (PORCINE) IN NACL 1000-0.9 UT/500ML-% IV SOLN
INTRAVENOUS | Status: DC | PRN
  Administered 2023-04-18: 500 mL

## 2023-04-18 MED ORDER — MIDAZOLAM HCL 2 MG/ML PO SYRP: 8.0000 mg | ORAL_SOLUTION | Freq: Once | ORAL | Status: AC | PRN

## 2023-04-18 MED ORDER — HYDRALAZINE HCL 20 MG/ML IJ SOLN: 10.0000 mg | Freq: Once | INTRAMUSCULAR | Status: AC

## 2023-04-18 MED ORDER — HYDROMORPHONE HCL 1 MG/ML IJ SOLN: 1.0000 mg | Freq: Once | INTRAMUSCULAR | Status: DC | PRN

## 2023-04-18 MED ORDER — FENTANYL CITRATE (PF) 100 MCG/2ML IJ SOLN
INTRAMUSCULAR | Status: DC | PRN
  Administered 2023-04-18: 50 ug via INTRAVENOUS

## 2023-04-18 MED ORDER — METHYLPREDNISOLONE SODIUM SUCC 125 MG IJ SOLR: 125.0000 mg | Freq: Once | INTRAMUSCULAR | Status: DC | PRN

## 2023-04-18 MED ORDER — HYDRALAZINE HCL 20 MG/ML IJ SOLN
INTRAMUSCULAR | Status: AC
  Administered 2023-04-18: 10 mg via INTRAVENOUS
  Filled 2023-04-18: qty 1

## 2023-04-18 MED ORDER — ONDANSETRON HCL 4 MG/2ML IJ SOLN
INTRAMUSCULAR | Status: AC
  Filled 2023-04-18: qty 2

## 2023-04-18 MED ORDER — HEPARIN SODIUM (PORCINE) 10000 UNIT/ML IJ SOLN
INTRAMUSCULAR | Status: AC
  Filled 2023-04-18: qty 1

## 2023-04-18 MED ORDER — MIDAZOLAM HCL 5 MG/5ML IJ SOLN
INTRAMUSCULAR | Status: AC
Start: 2023-04-18 — End: ?
  Filled 2023-04-18: qty 5

## 2023-04-18 SURGICAL SUPPLY — 14 items
BIOPATCH RED 1 DISK 7.0 (GAUZE/BANDAGES/DRESSINGS) IMPLANT
CATH CANNON HEMO 15FR 19 (HEMODIALYSIS SUPPLIES) IMPLANT
COVER PROBE ULTRASOUND 5X96 (MISCELLANEOUS) IMPLANT
DERMABOND ADVANCED .7 DNX12 (GAUZE/BANDAGES/DRESSINGS) IMPLANT
DRAPE BRACHIAL (DRAPES) IMPLANT
NDL ENTRY 21GA 7CM ECHOTIP (NEEDLE) IMPLANT
NEEDLE ENTRY 21GA 7CM ECHOTIP (NEEDLE) ×1 IMPLANT
PACK ANGIOGRAPHY (CUSTOM PROCEDURE TRAY) IMPLANT
PANNUS RETENTION SYSTEM 2 PAD (MISCELLANEOUS) IMPLANT
SET INTRO CAPELLA COAXIAL (SET/KITS/TRAYS/PACK) IMPLANT
SHEATH BRITE TIP 5FRX11 (SHEATH) IMPLANT
SUT MNCRL AB 4-0 PS2 18 (SUTURE) IMPLANT
SUT PROLENE 0 CT 1 30 (SUTURE) IMPLANT
WIRE GUIDERIGHT .035X150 (WIRE) IMPLANT

## 2023-04-18 NOTE — Interval H&P Note (Signed)
History and Physical Interval Note:  04/18/2023 10:03 AM  Sierra Lee  has presented today for surgery, with the diagnosis of Perma Calth Insertion    CKD 4 OK add on Mary G.  The various methods of treatment have been discussed with the patient and family. After consideration of risks, benefits and other options for treatment, the patient has consented to  Procedure(s): DIALYSIS/PERMA CATHETER INSERTION (N/A) as a surgical intervention.  The patient's history has been reviewed, patient examined, no change in status, stable for surgery.  I have reviewed the patient's chart and labs.  Questions were answered to the patient's satisfaction.     Festus Barren

## 2023-04-18 NOTE — Op Note (Signed)
OPERATIVE NOTE    PRE-OPERATIVE DIAGNOSIS: 1. ESRD   POST-OPERATIVE DIAGNOSIS: same as above  PROCEDURE: Ultrasound guidance for vascular access to the right internal jugular vein Fluoroscopic guidance for placement of catheter Placement of a 19 cm tip to cuff tunneled hemodialysis catheter via the right internal jugular vein  SURGEON: Festus Barren, MD  ANESTHESIA:  Local with Moderate conscious sedation for approximately 18 minutes using 2 mg of Versed and 50 mcg of Fentanyl  ESTIMATED BLOOD LOSS: 5 cc  FLUORO TIME: less than one minute  CONTRAST: none  FINDING(S): 1.  Patent right internal jugular vein  SPECIMEN(S):  None  INDICATIONS:   Sierra Lee is a 33 y.o.female who presents with renal failure.  The patient needs long term dialysis access for their ESRD, and a Permcath is necessary.  Risks and benefits are discussed and informed consent is obtained.    DESCRIPTION: After obtaining full informed written consent, the patient was brought back to the vascular suited. The patient's right neck and chest were sterilely prepped and draped in a sterile surgical field was created. Moderate conscious sedation was administered during a face to face encounter with the patient throughout the procedure with my supervision of the RN administering medicines and monitoring the patient's vital signs, pulse oximetry, telemetry and mental status throughout from the start of the procedure until the patient was taken to the recovery room.  The right internal jugular vein was visualized with ultrasound and found to be patent. It was then accessed under direct ultrasound guidance and a permanent image was recorded. A wire was placed. After skin nick and dilatation, the peel-away sheath was placed over the wire. I then turned my attention to an area under the clavicle. Approximately 1-2 fingerbreadths below the clavicle a small counterincision was created and tunneled from the subclavicular incision  to the access site. Using fluoroscopic guidance, a 19 centimeter tip to cuff tunneled hemodialysis catheter was selected, and tunneled from the subclavicular incision to the access site. It was then placed through the peel-away sheath and the peel-away sheath was removed. Using fluoroscopic guidance the catheter tips were parked in the right atrium. The appropriate distal connectors were placed. It withdrew blood well and flushed easily with heparinized saline and a concentrated heparin solution was then placed. It was secured to the chest wall with 2 Prolene sutures. The access incision was closed single 4-0 Monocryl. A 4-0 Monocryl pursestring suture was placed around the exit site. Sterile dressings were placed. The patient tolerated the procedure well and was taken to the recovery room in stable condition.  COMPLICATIONS: None  CONDITION: Stable  Festus Barren, MD 04/18/2023 11:56 AM   This note was created with Dragon Medical transcription system. Any errors in dictation are purely unintentional.

## 2023-04-18 NOTE — Telephone Encounter (Signed)
Per Vivia Birmingham patient does not need 2 week hospital follow up. Patient will be getting call from Vernona Rieger to schedule another procedure per FB.

## 2023-04-18 NOTE — Progress Notes (Addendum)
Patient received hydralazine IV for elevated BP. She states she had it before, but "the nurse pushed it fast", which made her BP drop very quickly with a SBP in the 110s and made her feel nauseous. She agreed to take the hydralazine as long as it was pushed slow. Hydralazine given, then about later patient began to feel nauseous. RN taking care of her did interventions to alleviate nausea. When I came back, I gave zofran IV. Patient states she is starting to feel better.  PO hydralazine doesn't cause these symptoms, but IV hydralazine does. Allergy added for this reason.

## 2023-04-19 ENCOUNTER — Ambulatory Visit: Payer: Medicaid Other

## 2023-04-20 DIAGNOSIS — N179 Acute kidney failure, unspecified: Secondary | ICD-10-CM | POA: Diagnosis not present

## 2023-04-20 DIAGNOSIS — N184 Chronic kidney disease, stage 4 (severe): Secondary | ICD-10-CM | POA: Diagnosis not present

## 2023-04-21 ENCOUNTER — Telehealth (INDEPENDENT_AMBULATORY_CARE_PROVIDER_SITE_OTHER): Payer: Self-pay

## 2023-04-21 NOTE — Telephone Encounter (Signed)
 Spoke with the patient and she is scheduled with Dr. Vonna Guardian for a left brachialcephalic AV fistula on 05/12/23 at [facility]. Pre-op  is on 05/05/23 at 10:00 am at the [facility]. Pre-surgical instructions were discussed and will be sent to Mychart and mailed.

## 2023-04-22 DIAGNOSIS — N184 Chronic kidney disease, stage 4 (severe): Secondary | ICD-10-CM | POA: Diagnosis not present

## 2023-04-22 DIAGNOSIS — N179 Acute kidney failure, unspecified: Secondary | ICD-10-CM | POA: Diagnosis not present

## 2023-04-25 DIAGNOSIS — N184 Chronic kidney disease, stage 4 (severe): Secondary | ICD-10-CM | POA: Diagnosis not present

## 2023-04-25 DIAGNOSIS — N179 Acute kidney failure, unspecified: Secondary | ICD-10-CM | POA: Diagnosis not present

## 2023-04-26 ENCOUNTER — Encounter (INDEPENDENT_AMBULATORY_CARE_PROVIDER_SITE_OTHER): Payer: Self-pay | Admitting: Vascular Surgery

## 2023-04-27 DIAGNOSIS — N184 Chronic kidney disease, stage 4 (severe): Secondary | ICD-10-CM | POA: Diagnosis not present

## 2023-04-27 DIAGNOSIS — N179 Acute kidney failure, unspecified: Secondary | ICD-10-CM | POA: Diagnosis not present

## 2023-04-29 DIAGNOSIS — D649 Anemia, unspecified: Secondary | ICD-10-CM | POA: Diagnosis not present

## 2023-04-29 DIAGNOSIS — Z992 Dependence on renal dialysis: Secondary | ICD-10-CM | POA: Diagnosis not present

## 2023-04-29 DIAGNOSIS — N179 Acute kidney failure, unspecified: Secondary | ICD-10-CM | POA: Diagnosis not present

## 2023-04-29 DIAGNOSIS — N184 Chronic kidney disease, stage 4 (severe): Secondary | ICD-10-CM | POA: Diagnosis not present

## 2023-05-02 DIAGNOSIS — N184 Chronic kidney disease, stage 4 (severe): Secondary | ICD-10-CM | POA: Diagnosis not present

## 2023-05-02 DIAGNOSIS — N179 Acute kidney failure, unspecified: Secondary | ICD-10-CM | POA: Diagnosis not present

## 2023-05-03 DIAGNOSIS — N184 Chronic kidney disease, stage 4 (severe): Secondary | ICD-10-CM | POA: Diagnosis not present

## 2023-05-03 DIAGNOSIS — N179 Acute kidney failure, unspecified: Secondary | ICD-10-CM | POA: Diagnosis not present

## 2023-05-05 ENCOUNTER — Other Ambulatory Visit (INDEPENDENT_AMBULATORY_CARE_PROVIDER_SITE_OTHER): Payer: Self-pay | Admitting: Nurse Practitioner

## 2023-05-05 ENCOUNTER — Encounter
Admission: RE | Admit: 2023-05-05 | Discharge: 2023-05-05 | Disposition: A | Discharge status: Home / Self Care | Payer: Medicaid Other | Source: Ambulatory Visit | Attending: Vascular Surgery | Admitting: Vascular Surgery

## 2023-05-05 ENCOUNTER — Encounter: Payer: Self-pay | Admitting: Family Medicine

## 2023-05-05 ENCOUNTER — Other Ambulatory Visit: Payer: Medicaid Other

## 2023-05-05 VITALS — Ht 65.0 in | Wt 339.9 lb

## 2023-05-05 DIAGNOSIS — N184 Chronic kidney disease, stage 4 (severe): Secondary | ICD-10-CM

## 2023-05-05 DIAGNOSIS — Z01818 Encounter for other preprocedural examination: Secondary | ICD-10-CM

## 2023-05-05 HISTORY — DX: Dependence on renal dialysis: Z99.2

## 2023-05-05 HISTORY — DX: Obstructive sleep apnea (adult) (pediatric): G47.33

## 2023-05-05 HISTORY — DX: Dyspnea, unspecified: R06.00

## 2023-05-05 HISTORY — DX: Personal history of nicotine dependence: Z87.891

## 2023-05-05 HISTORY — DX: Gastro-esophageal reflux disease without esophagitis: K21.9

## 2023-05-05 HISTORY — DX: End stage renal disease: N18.6

## 2023-05-05 HISTORY — DX: Morbid (severe) obesity due to excess calories: E66.01

## 2023-05-05 NOTE — Patient Instructions (Addendum)
Your procedure is scheduled on:05-12-23 Thursday Report to the Registration Desk on the 1st floor of the [facility].Then proceed to the 2nd floor Surgery Desk To find out your arrival time, please call [phone] between 1PM - 3PM on:05-11-23 Wednesday If your arrival time is 6:00 am, do not arrive before that time as the [facility] entrance doors do not open until 6:00 am.  REMEMBER: Instructions that are not followed completely may result in serious medical risk, up to and including death; or upon the discretion of your surgeon and anesthesiologist your surgery may need to be rescheduled.  Do not eat food OR drink any liquids after midnight the night before surgery.  No gum chewing or hard candies.  One week prior to surgery:Stop NOW (05-05-23) Stop Anti-inflammatories (NSAIDS) such as Advil, Aleve, Ibuprofen, Motrin, Naproxen, Naprosyn and Aspirin based products such as Excedrin, Goody's Powder, BC Powder. Stop ANY OVER THE COUNTER supplements until after surgery (Multivitamin)  You may however, continue to take Tylenol if needed for pain up until the day of surgery.  Continue taking all of your other prescription medications up until the day of surgery.  ON THE DAY OF SURGERY ONLY TAKE THESE MEDICATIONS WITH SIPS OF WATER: -amLODipine (NORVASC)  -cloNIDine (CATAPRES)  -hydrALAZINE (APRESOLINE)   No Alcohol for 24 hours before or after surgery.  No Smoking including e-cigarettes for 24 hours before surgery.  No chewable tobacco products for at least 6 hours before surgery.  No nicotine patches on the day of surgery.  Do not use any "recreational" drugs for at least a week (preferably 2 weeks) before your surgery.  Please be advised that the combination of cocaine and anesthesia may have negative outcomes, up to and including death. If you test positive for cocaine, your surgery will be cancelled.  On the morning of surgery brush your teeth with toothpaste and water, you may  rinse your mouth with mouthwash if you wish. Do not swallow any toothpaste or mouthwash.  Use CHG Wipes as directed on instruction sheet.  Do not wear jewelry, make-up, hairpins, clips or nail polish.  For welded (permanent) jewelry: bracelets, anklets, waist bands, etc.  Please have this removed prior to surgery.  If it is not removed, there is a chance that hospital personnel will need to cut it off on the day of surgery.  Do not wear lotions, powders, or perfumes.   Do not shave body hair from the neck down 48 hours before surgery.  Contact lenses, hearing aids and dentures may not be worn into surgery.  Do not bring valuables to the hospital. [facility] is not responsible for any missing/lost belongings or valuables.   Bring your C-PAP to the hospital   Notify your doctor if there is any change in your medical condition (cold, fever, infection).  Wear comfortable clothing (specific to your surgery type) to the hospital.  After surgery, you can help prevent lung complications by doing breathing exercises.  Take deep breaths and cough every 1-2 hours. Your doctor may order a device called an Incentive Spirometer to help you take deep breaths. When coughing or sneezing, hold a pillow firmly against your incision with both hands. This is called "splinting." Doing this helps protect your incision. It also decreases belly discomfort.  If you are being admitted to the hospital overnight, leave your suitcase in the car. After surgery it may be brought to your room.  In case of increased patient census, it may be necessary for you,  the patient, to continue your postoperative care in the Same Day Surgery department.  If you are being discharged the day of surgery, you will not be allowed to drive home. You will need a responsible individual to drive you home and stay with you for 24 hours after surgery.   If you are taking public transportation, you will need to have a responsible  individual with you.  Please call the Pre-admissions Testing Dept. at [phone] if you have any questions about these instructions.  Surgery Visitation Policy:  Patients having surgery or a procedure may have two visitors.  Children under the age of 41 must have an adult with them who is not the patient.  Temporary Visitor Restrictions Due to increasing cases of flu, RSV and COVID-19: Children ages 41 and under will not be able to visit patients in [facility] hospitals under most circumstances.   Preparing the Skin Before Surgery     To help prevent the risk of infection at your surgical site, we are now providing you with rinse-free Sage 2% Chlorhexidine Gluconate (CHG) disposable wipes.  Chlorhexidine Gluconate (CHG) Soap  o An antiseptic cleaner that kills germs and bonds with the skin to continue killing germs even after washing  o Used for showering the night before surgery and morning of surgery  The night before surgery: Shower or bathe with warm water. Do not apply perfume, lotions, powders. Wait one hour after shower. Skin should be dry and cool. Open Sage wipe package - use 6 disposable cloths. Wipe body using one cloth for the right arm, one cloth for the left arm, one cloth for the right leg, one cloth for the left leg, one cloth for the chest/abdomen area, and one cloth for the back. Do not use on open wounds or sores. Do not use on face or genitals (private parts). If you are breast feeding, do not use on breasts. 5. Do not rinse, allow to dry. 6. Skin may feel "tacky" for several minutes. 7. Dress in clean clothes. 8. Place clean sheets on your bed and do not sleep with pets.  REPEAT ABOVE ON THE MORNING OF SURGERY BEFORE ARRIVING TO THE HOSPITAL.

## 2023-05-06 ENCOUNTER — Encounter: Payer: Self-pay | Admitting: Urgent Care

## 2023-05-06 ENCOUNTER — Inpatient Hospital Stay: Admission: RE | Admit: 2023-05-06 | Payer: Medicaid Other | Source: Ambulatory Visit

## 2023-05-06 DIAGNOSIS — N179 Acute kidney failure, unspecified: Secondary | ICD-10-CM | POA: Diagnosis not present

## 2023-05-06 DIAGNOSIS — N184 Chronic kidney disease, stage 4 (severe): Secondary | ICD-10-CM | POA: Diagnosis not present

## 2023-05-06 DIAGNOSIS — G4733 Obstructive sleep apnea (adult) (pediatric): Secondary | ICD-10-CM | POA: Diagnosis not present

## 2023-05-09 DIAGNOSIS — N179 Acute kidney failure, unspecified: Secondary | ICD-10-CM | POA: Diagnosis not present

## 2023-05-09 DIAGNOSIS — N184 Chronic kidney disease, stage 4 (severe): Secondary | ICD-10-CM | POA: Diagnosis not present

## 2023-05-09 DIAGNOSIS — D649 Anemia, unspecified: Secondary | ICD-10-CM | POA: Diagnosis not present

## 2023-05-10 ENCOUNTER — Telehealth (INDEPENDENT_AMBULATORY_CARE_PROVIDER_SITE_OTHER): Payer: Self-pay

## 2023-05-10 ENCOUNTER — Inpatient Hospital Stay
Admission: RE | Admit: 2023-05-10 | Discharge: 2023-05-10 | Disposition: A | Discharge status: Home / Self Care | Payer: Medicaid Other | Source: Ambulatory Visit

## 2023-05-10 DIAGNOSIS — G4733 Obstructive sleep apnea (adult) (pediatric): Secondary | ICD-10-CM | POA: Diagnosis not present

## 2023-05-10 NOTE — Progress Notes (Signed)
 Patient scheduled for AV fistula placement Thursday, February 27. No show for labs on 3 separate days. No answer on her phone. Call to mother which stated that patient is cancelling this surgery until she gets a second opinion maybe end of March. Instructed mother that the patient needs to call Dr. Driscilla Grammes office to let them know. Dr. Wyn Quaker and office personnel made aware via secure chat of above.

## 2023-05-10 NOTE — Telephone Encounter (Signed)
 This is from Dahlia Bailiff RN:  patient no show x 3 for pre-surgery labs; called her mother..mother said that patient is getting a second opinion end of March so she is not getting this surgery Thursday...instructed that the patient needs to call Dr. Wyn Quaker office to let them know  I have not received a call from the patient, she has been canceled per this information at this time.

## 2023-05-11 DIAGNOSIS — N184 Chronic kidney disease, stage 4 (severe): Secondary | ICD-10-CM | POA: Diagnosis not present

## 2023-05-11 DIAGNOSIS — N179 Acute kidney failure, unspecified: Secondary | ICD-10-CM | POA: Diagnosis not present

## 2023-05-11 DIAGNOSIS — Z992 Dependence on renal dialysis: Secondary | ICD-10-CM | POA: Diagnosis not present

## 2023-05-12 ENCOUNTER — Encounter: Admission: RE | Payer: Self-pay | Source: Ambulatory Visit

## 2023-05-12 ENCOUNTER — Ambulatory Visit: Admission: RE | Admit: 2023-05-12 | Payer: Medicaid Other | Source: Ambulatory Visit | Admitting: Vascular Surgery

## 2023-05-12 SURGERY — ARTERIOVENOUS (AV) FISTULA CREATION
Anesthesia: General | Laterality: Left

## 2023-05-13 DIAGNOSIS — N184 Chronic kidney disease, stage 4 (severe): Secondary | ICD-10-CM | POA: Diagnosis not present

## 2023-05-13 DIAGNOSIS — N179 Acute kidney failure, unspecified: Secondary | ICD-10-CM | POA: Diagnosis not present

## 2023-05-13 DIAGNOSIS — D509 Iron deficiency anemia, unspecified: Secondary | ICD-10-CM | POA: Diagnosis not present

## 2023-05-13 DIAGNOSIS — Z992 Dependence on renal dialysis: Secondary | ICD-10-CM | POA: Diagnosis not present

## 2023-05-16 DIAGNOSIS — N184 Chronic kidney disease, stage 4 (severe): Secondary | ICD-10-CM | POA: Diagnosis not present

## 2023-05-16 DIAGNOSIS — D509 Iron deficiency anemia, unspecified: Secondary | ICD-10-CM | POA: Diagnosis not present

## 2023-05-16 DIAGNOSIS — D649 Anemia, unspecified: Secondary | ICD-10-CM | POA: Diagnosis not present

## 2023-05-16 DIAGNOSIS — Z992 Dependence on renal dialysis: Secondary | ICD-10-CM | POA: Diagnosis not present

## 2023-05-16 DIAGNOSIS — N179 Acute kidney failure, unspecified: Secondary | ICD-10-CM | POA: Diagnosis not present

## 2023-05-20 DIAGNOSIS — Z992 Dependence on renal dialysis: Secondary | ICD-10-CM | POA: Diagnosis not present

## 2023-05-20 DIAGNOSIS — N179 Acute kidney failure, unspecified: Secondary | ICD-10-CM | POA: Diagnosis not present

## 2023-05-20 DIAGNOSIS — N184 Chronic kidney disease, stage 4 (severe): Secondary | ICD-10-CM | POA: Diagnosis not present

## 2023-05-23 ENCOUNTER — Encounter: Payer: Self-pay | Admitting: Family Medicine

## 2023-05-23 DIAGNOSIS — Z992 Dependence on renal dialysis: Secondary | ICD-10-CM | POA: Diagnosis not present

## 2023-05-23 DIAGNOSIS — N179 Acute kidney failure, unspecified: Secondary | ICD-10-CM | POA: Diagnosis not present

## 2023-05-23 DIAGNOSIS — N184 Chronic kidney disease, stage 4 (severe): Secondary | ICD-10-CM | POA: Diagnosis not present

## 2023-05-23 DIAGNOSIS — D509 Iron deficiency anemia, unspecified: Secondary | ICD-10-CM | POA: Diagnosis not present

## 2023-05-23 DIAGNOSIS — D649 Anemia, unspecified: Secondary | ICD-10-CM | POA: Diagnosis not present

## 2023-05-24 ENCOUNTER — Telehealth: Payer: Self-pay

## 2023-05-24 NOTE — Telephone Encounter (Signed)
 Attempted to contact the pt but was only able to leave a voice message asking her to contact the office back regarding this matter

## 2023-05-24 NOTE — Telephone Encounter (Signed)
 Called and was able to inform the pt her appt is for 3/31. She stated she understood and had no other questions

## 2023-05-24 NOTE — Telephone Encounter (Unsigned)
 Copied from CRM [id number]. Topic: Referral - Status >> May 23, 2023  1:01 PM Sierra Lee wrote: Reason for CRM: pt stated that she was told from [facility] that provider cancelled her appt. Pt would like to know why and get a call back pt callback. [phone].   Advised pt there was an appt for 03/31 at 8:30 am, but pt would still like to speak to someone.

## 2023-05-25 DIAGNOSIS — Z992 Dependence on renal dialysis: Secondary | ICD-10-CM | POA: Diagnosis not present

## 2023-05-25 DIAGNOSIS — N184 Chronic kidney disease, stage 4 (severe): Secondary | ICD-10-CM | POA: Diagnosis not present

## 2023-05-25 DIAGNOSIS — N179 Acute kidney failure, unspecified: Secondary | ICD-10-CM | POA: Diagnosis not present

## 2023-05-27 DIAGNOSIS — Z992 Dependence on renal dialysis: Secondary | ICD-10-CM | POA: Diagnosis not present

## 2023-05-27 DIAGNOSIS — N184 Chronic kidney disease, stage 4 (severe): Secondary | ICD-10-CM | POA: Diagnosis not present

## 2023-05-27 DIAGNOSIS — N179 Acute kidney failure, unspecified: Secondary | ICD-10-CM | POA: Diagnosis not present

## 2023-05-30 ENCOUNTER — Ambulatory Visit (INDEPENDENT_AMBULATORY_CARE_PROVIDER_SITE_OTHER): Admitting: Physician Assistant

## 2023-05-30 ENCOUNTER — Ambulatory Visit: Payer: Self-pay | Admitting: Family Medicine

## 2023-05-30 ENCOUNTER — Encounter: Payer: Self-pay | Admitting: Physician Assistant

## 2023-05-30 ENCOUNTER — Encounter: Payer: Self-pay | Admitting: Family Medicine

## 2023-05-30 VITALS — BP 144/93 | HR 89 | Temp 98.2°F | Resp 16 | Ht 65.0 in | Wt 217.5 lb

## 2023-05-30 DIAGNOSIS — R058 Other specified cough: Secondary | ICD-10-CM

## 2023-05-30 DIAGNOSIS — Z992 Dependence on renal dialysis: Secondary | ICD-10-CM | POA: Diagnosis not present

## 2023-05-30 DIAGNOSIS — N179 Acute kidney failure, unspecified: Secondary | ICD-10-CM | POA: Diagnosis not present

## 2023-05-30 DIAGNOSIS — I10 Essential (primary) hypertension: Secondary | ICD-10-CM | POA: Diagnosis not present

## 2023-05-30 DIAGNOSIS — R0602 Shortness of breath: Secondary | ICD-10-CM

## 2023-05-30 DIAGNOSIS — J4520 Mild intermittent asthma, uncomplicated: Secondary | ICD-10-CM | POA: Diagnosis not present

## 2023-05-30 DIAGNOSIS — D509 Iron deficiency anemia, unspecified: Secondary | ICD-10-CM | POA: Diagnosis not present

## 2023-05-30 DIAGNOSIS — G4733 Obstructive sleep apnea (adult) (pediatric): Secondary | ICD-10-CM | POA: Diagnosis not present

## 2023-05-30 DIAGNOSIS — R062 Wheezing: Secondary | ICD-10-CM

## 2023-05-30 DIAGNOSIS — N184 Chronic kidney disease, stage 4 (severe): Secondary | ICD-10-CM

## 2023-05-30 MED ORDER — ALBUTEROL SULFATE HFA 108 (90 BASE) MCG/ACT IN AERS: 2.0000 | INHALATION_SPRAY | Freq: Four times a day (QID) | RESPIRATORY_TRACT | 2 refills | Status: AC | PRN

## 2023-05-30 NOTE — Telephone Encounter (Signed)
 Copied from CRM [id number]. Topic: Clinical - Red Word Triage >> May 30, 2023  8:16 AM Clayton Bibles wrote: Red Word that prompted transfer to Nurse Triage:  She is having a hard time breathing when she walks, laying down. She goes to dialyses 3 times weekly and nurse there told her she needs to request for an inhaler Reason for Disposition  [1] MODERATE longstanding difficulty breathing (e.g., speaks in phrases, SOB even at rest, pulse 100-120) AND [2] SAME as normal    On dialysis  Answer Assessment - Initial Assessment Questions 1. RESPIRATORY STATUS: "Describe your breathing?" (e.g., wheezing, shortness of breath, unable to speak, severe coughing)      I'm having trouble breathing when walking around or laying down.   I have bronchitis.  I take treatments for it.  I use a nebulizer.    2. ONSET: "When did this breathing problem begin?"      2 years ago.   Go dialysis three times a week.    My shortness of breath is not any worse than my usual.   I go Mon, Wed. And Fri.    I'm still out of breath all the time even after dialysis.    She is at dialysis now.    They told me to call and get an inhaler.   I used an inhaler years ago.    3. PATTERN "Does the difficult breathing come and go, or has it been constant since it started?"      Constant   4. SEVERITY: "How bad is your breathing?" (e.g., mild, moderate, severe)    - MILD: No SOB at rest, mild SOB with walking, speaks normally in sentences, can lie down, no retractions, pulse < 100.    - MODERATE: SOB at rest, SOB with minimal exertion and prefers to sit, cannot lie down flat, speaks in phrases, mild retractions, audible wheezing, pulse 100-120.    - SEVERE: Very SOB at rest, speaks in single words, struggling to breathe, sitting hunched forward, retractions, pulse > 120      All the time. 5. RECURRENT SYMPTOM: "Have you had difficulty breathing before?" If Yes, ask: "When was the last time?" and "What happened that time?"      Yes 6. CARDIAC  HISTORY: "Do you have any history of heart disease?" (e.g., heart attack, angina, bypass surgery, angioplasty)      No 7. LUNG HISTORY: "Do you have any history of lung disease?"  (e.g., pulmonary embolus, asthma, emphysema)     No 8. CAUSE: "What do you think is causing the breathing problem?"      No heart failure.    Just shortness of breath 9. OTHER SYMPTOMS: "Do you have any other symptoms? (e.g., dizziness, runny nose, cough, chest pain, fever)     No lightheadedness.   I break out in a sweat when I'm walking.  10. O2 SATURATION MONITOR:  "Do you use an oxygen saturation monitor (pulse oximeter) at home?" If Yes, ask: "What is your reading (oxygen level) today?" "What is your usual oxygen saturation reading?" (e.g., 95%)       93% now while on dialysis.   11. PREGNANCY: "Is there any chance you are pregnant?" "When was your last menstrual period?"       Not asked 12. TRAVEL: "Have you traveled out of the country in the last month?" (e.g., travel history, exposures)       N/A  Protocols used: Breathing Difficulty-A-AH  Chief Complaint: shortness of breath  all the time with walking and sitting even after her dialysis treatment   Requesting an inhaler.   USed on yrs ago Symptoms: shortness of breath all the time for the last 2 yrs but getting worse with activity and laying dow Frequency: The last 2 yrs    Used an inhaler several yrs. Ago.    Pertinent Negatives: Patient denies recent URI illlnesses Disposition: [] ED /[] Urgent Care (no appt availability in office) / [x] Appointment(In office/virtual)/ []  [facility]/ [] Home Care/ [] Refused Recommended Disposition /[] [facility]/ []  Follow-up with PCP Additional Notes: Appt made for this afternoon.

## 2023-05-30 NOTE — Telephone Encounter (Signed)
 Agree with evaluation in clinic as scheduled

## 2023-05-30 NOTE — Telephone Encounter (Signed)
 FYI, please see the message below. She has been scheduled to see [place] today

## 2023-05-30 NOTE — Progress Notes (Unsigned)
 Established patient visit  Patient: Sierra Lee   DOB: 1991-03-10   33 y.o. Female  MRN: [id number] Visit Date: 05/30/2023  Today's healthcare provider: Debera Lat, PA-C   Chief Complaint  Patient presents with   Shortness of Breath    SOB 2x weeks No other concerns   Subjective       Discussed the use of AI scribe software for clinical note transcription with the patient, who gave verbal consent to proceed.  History of Present Illness   The patient, with a history of hypertension, kidney disease requiring dialysis, and recent smoking cessation, presents with a two-week history of shortness of breath. The patient reports that the shortness of breath is so severe that she becomes breathless after a short walk. The patient denies any associated cough, chest pain, or rapid heart beating. The patient has a history of asthma and seasonal allergies, and used to use an albuterol inhaler when living in [place], but has not used an inhaler for the past five to six years since moving. The patient also reports a history of bronchitis.  The patient's hypertension is being managed with amlodipine, clonidine, hydralazine, and a clonidine transdermal patch. Despite this, the patient reports that her blood pressure readings at the dialysis center are often high, around 200, while at home, the readings are usually around 150 or lower. The patient has been on dialysis since January and is considering getting on a transplant list, but has been advised to lose weight first. The patient has successfully lost 36 pounds so far.  The patient also has a history of severe obstructive sleep apnea and uses a CPAP machine nightly. The patient reports no current issues with acid reflux. The patient also reports a recent decrease in leg swelling after dialysis.           05/30/2023    4:00 PM 02/22/2023    9:45 AM 01/26/2023   10:01 AM  Depression screen PHQ 2/9  Decreased Interest 1 2 2   Down,  Depressed, Hopeless 1 0 2  PHQ - 2 Score 2 2 4   Altered sleeping 1 3 2   Tired, decreased energy 1 2 2   Change in appetite 1 1 1   Feeling bad or failure about yourself  1 0 1  Trouble concentrating 1 0 1  Moving slowly or fidgety/restless  0 0  Suicidal thoughts 0 0 0  PHQ-9 Score 7 8 11   Difficult doing work/chores Somewhat difficult Somewhat difficult       05/30/2023    4:00 PM 02/22/2023    9:46 AM 01/26/2023   10:02 AM  GAD 7 : Generalized Anxiety Score  Nervous, Anxious, on Edge 1 1 1   Control/stop worrying 1 1 1   Worry too much - different things 1 1 1   Trouble relaxing 1 1 1   Restless 1 1 3   Easily annoyed or irritable 2 1 3   Afraid - awful might happen 0 0 1  Total GAD 7 Score 7 6 11   Anxiety Difficulty Somewhat difficult Somewhat difficult     Medications: Outpatient Medications Prior to Visit  Medication Sig   acetaminophen (TYLENOL) 325 MG tablet Take 2 tablets (650 mg total) by mouth every 6 (six) hours as needed for mild pain (pain score 1-3) (or Fever >/= 101).   amLODipine (NORVASC) 10 MG tablet Take 1 tablet (10 mg total) by mouth daily. (Patient taking differently: Take 10 mg by mouth every morning.)   calcitRIOL (ROCALTROL) 0.25 MCG capsule Take 0.25 mcg  by mouth daily.   cloNIDine (CATAPRES) 0.1 MG tablet Take 0.1 mg by mouth 2 (two) times daily.   hydrALAZINE (APRESOLINE) 10 MG tablet Take 10 mg by mouth 2 (two) times daily.   Multiple Vitamin (MULTIVITAMIN) capsule Take 1 capsule by mouth daily.   torsemide (DEMADEX) 100 MG tablet Take 100 mg by mouth every morning.   valsartan (DIOVAN) 160 MG tablet Take 160 mg by mouth every morning.   Blood Pressure Monitoring KIT Use to measure blood pressure as needed for home monitoring   No facility-administered medications prior to visit.    Review of Systems All negative Except see HPI   {Insert previous labs (optional):23779} {See past labs  Heme  Chem  Endocrine  Serology  Results Review  (optional):1}   Objective    BP (!) 144/93 (BP Location: Left Arm, Patient Position: Sitting, Cuff Size: Large)   Pulse 89   Temp 98.2 F (36.8 C) (Oral)   Resp 16   Ht 5\' 5"  (1.651 m)   Wt 217 lb 8 oz (98.7 kg)   LMP 04/16/2023 (Exact Date)   SpO2 99%   BMI 36.19 kg/m  {Insert last BP/Wt (optional):23777}{See vitals history (optional):1}   Physical Exam Vitals reviewed.  Constitutional:      General: She is not in acute distress.    Appearance: Normal appearance. She is well-developed. She is not diaphoretic.  HENT:     Head: Normocephalic and atraumatic.  Eyes:     General: No scleral icterus.    Conjunctiva/sclera: Conjunctivae normal.  Neck:     Thyroid: No thyromegaly.  Cardiovascular:     Rate and Rhythm: Normal rate and regular rhythm.     Pulses: Normal pulses.     Heart sounds: Normal heart sounds. No murmur heard. Pulmonary:     Effort: Pulmonary effort is normal. No respiratory distress.     Breath sounds: Normal breath sounds. No wheezing, rhonchi or rales.  Musculoskeletal:     Cervical back: Neck supple.     Right lower leg: No edema.     Left lower leg: No edema.  Lymphadenopathy:     Cervical: No cervical adenopathy.  Skin:    General: Skin is warm and dry.     Findings: No rash.  Neurological:     Mental Status: She is alert and oriented to person, place, and time. Mental status is at baseline.  Psychiatric:        Mood and Affect: Mood normal.        Behavior: Behavior normal.      No results found for any visits on 05/30/23.      Assessment and Plan    Shortness of Breath Shortness of breath likely due to asthma exacerbation. Differential includes asthma, COPD, or deconditioning. History of asthma and seasonal allergies may contribute. Former smoker, quit four months ago. Symptoms significant enough to cause difficulty walking short distances. - Prescribe albuterol inhaler as a rescue inhaler. - Refer to pulmonology for further  assessment and management. - Recommend nasal saline rinse, Flonase, and Zyrtec for allergy management.  Hypertension Hypertension managed with amlodipine, clonidine, hydralazine, torsemide, and valsartan. Blood pressure elevated at 144/93 mmHg. Reports discrepancies in blood pressure readings between home and dialysis center. - Reassess blood pressure in two weeks. - Ensure follow-up with primary care provider, Dr. Roxan Hockey, for continuous monitoring.  Chronic Kidney Disease on Dialysis On dialysis since January under nephrologist care. Reports low GFR and not on transplant list. Lost 36  pounds, primarily fluid weight, as part of transplant criteria. - Continue dialysis three times a week. - Discuss transplant list criteria with nephrologist.  Obstructive Sleep Apnea Severe obstructive sleep apnea managed with nightly CPAP and chin strap. Reports compliance with CPAP therapy.  General Health Maintenance Seasonal allergies and asthma managed with medications. Smoking cessation for four months, beneficial for overall health. - Encourage continued smoking cessation.        Orders Placed This Encounter  Procedures   Ambulatory referral to Pulmonology    Referral Priority:   Routine    Referral Type:   Consultation    Referral Reason:   Specialty Services Required    Requested Specialty:   Pulmonary Disease    Number of Visits Requested:   1    Return in about 2 weeks (around 06/13/2023) for BP f/u.   The patient was advised to call back or seek an in-person evaluation if the symptoms worsen or if the condition fails to improve as anticipated.  I discussed the assessment and treatment plan with the patient. The patient was provided an opportunity to ask questions and all were answered. The patient agreed with the plan and demonstrated an understanding of the instructions.  I, Debera Lat, PA-C have reviewed all documentation for this visit. The documentation on 05/30/2023  for the  exam, diagnosis, procedures, and orders are all accurate and complete.  Debera Lat, [facility], [facility] [phone] (phone) [phone] (fax)  [facility]

## 2023-06-01 DIAGNOSIS — Z992 Dependence on renal dialysis: Secondary | ICD-10-CM | POA: Diagnosis not present

## 2023-06-01 DIAGNOSIS — N179 Acute kidney failure, unspecified: Secondary | ICD-10-CM | POA: Diagnosis not present

## 2023-06-01 DIAGNOSIS — N184 Chronic kidney disease, stage 4 (severe): Secondary | ICD-10-CM | POA: Diagnosis not present

## 2023-06-03 DIAGNOSIS — Z992 Dependence on renal dialysis: Secondary | ICD-10-CM | POA: Diagnosis not present

## 2023-06-03 DIAGNOSIS — N179 Acute kidney failure, unspecified: Secondary | ICD-10-CM | POA: Diagnosis not present

## 2023-06-03 DIAGNOSIS — N184 Chronic kidney disease, stage 4 (severe): Secondary | ICD-10-CM | POA: Diagnosis not present

## 2023-06-03 DIAGNOSIS — G4733 Obstructive sleep apnea (adult) (pediatric): Secondary | ICD-10-CM | POA: Diagnosis not present

## 2023-06-06 DIAGNOSIS — D509 Iron deficiency anemia, unspecified: Secondary | ICD-10-CM | POA: Diagnosis not present

## 2023-06-06 DIAGNOSIS — D649 Anemia, unspecified: Secondary | ICD-10-CM | POA: Diagnosis not present

## 2023-06-06 DIAGNOSIS — N184 Chronic kidney disease, stage 4 (severe): Secondary | ICD-10-CM | POA: Diagnosis not present

## 2023-06-06 DIAGNOSIS — N179 Acute kidney failure, unspecified: Secondary | ICD-10-CM | POA: Diagnosis not present

## 2023-06-06 DIAGNOSIS — Z992 Dependence on renal dialysis: Secondary | ICD-10-CM | POA: Diagnosis not present

## 2023-06-07 DIAGNOSIS — G4733 Obstructive sleep apnea (adult) (pediatric): Secondary | ICD-10-CM | POA: Diagnosis not present

## 2023-06-08 DIAGNOSIS — N179 Acute kidney failure, unspecified: Secondary | ICD-10-CM | POA: Diagnosis not present

## 2023-06-08 DIAGNOSIS — N184 Chronic kidney disease, stage 4 (severe): Secondary | ICD-10-CM | POA: Diagnosis not present

## 2023-06-08 DIAGNOSIS — Z992 Dependence on renal dialysis: Secondary | ICD-10-CM | POA: Diagnosis not present

## 2023-06-10 DIAGNOSIS — N179 Acute kidney failure, unspecified: Secondary | ICD-10-CM | POA: Diagnosis not present

## 2023-06-10 DIAGNOSIS — Z992 Dependence on renal dialysis: Secondary | ICD-10-CM | POA: Diagnosis not present

## 2023-06-10 DIAGNOSIS — N184 Chronic kidney disease, stage 4 (severe): Secondary | ICD-10-CM | POA: Diagnosis not present

## 2023-06-12 NOTE — Progress Notes (Deleted)
 Established patient visit  Patient: Sierra Lee   DOB: 1990/04/26   33 y.o. Female  MRN: [id number] Visit Date: 06/13/2023  Today's healthcare provider: Debera Lat, PA-C   No chief complaint on file.  Subjective       Discussed the use of AI scribe software for clinical note transcription with the patient, who gave verbal consent to proceed.  History of Present Illness        05/30/2023    4:00 PM 02/22/2023    9:45 AM 01/26/2023   10:01 AM  Depression screen PHQ 2/9  Decreased Interest 1 2 2   Down, Depressed, Hopeless 1 0 2  PHQ - 2 Score 2 2 4   Altered sleeping 1 3 2   Tired, decreased energy 1 2 2   Change in appetite 1 1 1   Feeling bad or failure about yourself  1 0 1  Trouble concentrating 1 0 1  Moving slowly or fidgety/restless  0 0  Suicidal thoughts 0 0 0  PHQ-9 Score 7 8 11   Difficult doing work/chores Somewhat difficult Somewhat difficult       05/30/2023    4:00 PM 02/22/2023    9:46 AM 01/26/2023   10:02 AM  GAD 7 : Generalized Anxiety Score  Nervous, Anxious, on Edge 1 1 1   Control/stop worrying 1 1 1   Worry too much - different things 1 1 1   Trouble relaxing 1 1 1   Restless 1 1 3   Easily annoyed or irritable 2 1 3   Afraid - awful might happen 0 0 1  Total GAD 7 Score 7 6 11   Anxiety Difficulty Somewhat difficult Somewhat difficult     Medications: Outpatient Medications Prior to Visit  Medication Sig  . acetaminophen (TYLENOL) 325 MG tablet Take 2 tablets (650 mg total) by mouth every 6 (six) hours as needed for mild pain (pain score 1-3) (or Fever >/= 101).  Marland Kitchen albuterol (VENTOLIN HFA) 108 (90 Base) MCG/ACT inhaler Inhale 2 puffs into the lungs every 6 (six) hours as needed for wheezing or shortness of breath.  Marland Kitchen amLODipine (NORVASC) 10 MG tablet Take 1 tablet (10 mg total) by mouth daily. (Patient taking differently: Take 10 mg by mouth every morning.)  . Blood Pressure Monitoring KIT Use to measure blood pressure as needed for home  monitoring  . calcitRIOL (ROCALTROL) 0.25 MCG capsule Take 0.25 mcg by mouth daily.  . cloNIDine (CATAPRES) 0.1 MG tablet Take 0.1 mg by mouth 2 (two) times daily.  . hydrALAZINE (APRESOLINE) 10 MG tablet Take 10 mg by mouth 2 (two) times daily.  . Multiple Vitamin (MULTIVITAMIN) capsule Take 1 capsule by mouth daily.  Marland Kitchen torsemide (DEMADEX) 100 MG tablet Take 100 mg by mouth every morning.  . valsartan (DIOVAN) 160 MG tablet Take 160 mg by mouth every morning.   No facility-administered medications prior to visit.    Review of Systems  All other systems reviewed and are negative. All negative Except see HPI   {Insert previous labs (optional):23779} {See past labs  Heme  Chem  Endocrine  Serology  Results Review (optional):1}   Objective    There were no vitals taken for this visit. {Insert last BP/Wt (optional):23777}{See vitals history (optional):1}   Physical Exam Vitals reviewed.  Constitutional:      General: She is not in acute distress.    Appearance: Normal appearance. She is well-developed. She is not diaphoretic.  HENT:     Head: Normocephalic and atraumatic.  Eyes:     General:  No scleral icterus.    Conjunctiva/sclera: Conjunctivae normal.  Neck:     Thyroid: No thyromegaly.  Cardiovascular:     Rate and Rhythm: Normal rate and regular rhythm.     Pulses: Normal pulses.     Heart sounds: Normal heart sounds. No murmur heard. Pulmonary:     Effort: Pulmonary effort is normal. No respiratory distress.     Breath sounds: Normal breath sounds. No wheezing, rhonchi or rales.  Musculoskeletal:     Cervical back: Neck supple.     Right lower leg: No edema.     Left lower leg: No edema.  Lymphadenopathy:     Cervical: No cervical adenopathy.  Skin:    General: Skin is warm and dry.     Findings: No rash.  Neurological:     Mental Status: She is alert and oriented to person, place, and time. Mental status is at baseline.  Psychiatric:        Mood and  Affect: Mood normal.        Behavior: Behavior normal.     No results found for any visits on 06/13/23.      Assessment and Plan Assessment & Plan     No orders of the defined types were placed in this encounter.   No follow-ups on file.   The patient was advised to call back or seek an in-person evaluation if the symptoms worsen or if the condition fails to improve as anticipated.  I discussed the assessment and treatment plan with the patient. The patient was provided an opportunity to ask questions and all were answered. The patient agreed with the plan and demonstrated an understanding of the instructions.  I, Debera Lat, PA-C have reviewed all documentation for this visit. The documentation on 06/13/2023  for the exam, diagnosis, procedures, and orders are all accurate and complete.  Debera Lat, [facility], MMS [facility] [phone] (phone) [phone] (fax)  [facility]

## 2023-06-13 ENCOUNTER — Ambulatory Visit: Admitting: Physician Assistant

## 2023-06-13 DIAGNOSIS — I160 Hypertensive urgency: Secondary | ICD-10-CM | POA: Diagnosis not present

## 2023-06-13 DIAGNOSIS — E8770 Fluid overload, unspecified: Secondary | ICD-10-CM | POA: Insufficient documentation

## 2023-06-13 DIAGNOSIS — I110 Hypertensive heart disease with heart failure: Secondary | ICD-10-CM | POA: Diagnosis not present

## 2023-06-13 DIAGNOSIS — D631 Anemia in chronic kidney disease: Secondary | ICD-10-CM | POA: Insufficient documentation

## 2023-06-13 DIAGNOSIS — N186 End stage renal disease: Secondary | ICD-10-CM | POA: Diagnosis not present

## 2023-06-13 DIAGNOSIS — R0602 Shortness of breath: Secondary | ICD-10-CM | POA: Diagnosis not present

## 2023-06-13 DIAGNOSIS — J9600 Acute respiratory failure, unspecified whether with hypoxia or hypercapnia: Secondary | ICD-10-CM | POA: Diagnosis not present

## 2023-06-13 DIAGNOSIS — Z91158 Patient's noncompliance with renal dialysis for other reason: Secondary | ICD-10-CM | POA: Diagnosis not present

## 2023-06-13 DIAGNOSIS — Z79899 Other long term (current) drug therapy: Secondary | ICD-10-CM | POA: Diagnosis not present

## 2023-06-13 DIAGNOSIS — Z992 Dependence on renal dialysis: Secondary | ICD-10-CM | POA: Diagnosis not present

## 2023-06-13 DIAGNOSIS — I150 Renovascular hypertension: Secondary | ICD-10-CM | POA: Diagnosis not present

## 2023-06-13 DIAGNOSIS — G4733 Obstructive sleep apnea (adult) (pediatric): Secondary | ICD-10-CM | POA: Insufficient documentation

## 2023-06-13 DIAGNOSIS — N049 Nephrotic syndrome with unspecified morphologic changes: Secondary | ICD-10-CM | POA: Diagnosis not present

## 2023-06-13 DIAGNOSIS — N179 Acute kidney failure, unspecified: Secondary | ICD-10-CM | POA: Insufficient documentation

## 2023-06-13 DIAGNOSIS — R0689 Other abnormalities of breathing: Secondary | ICD-10-CM | POA: Diagnosis not present

## 2023-06-13 DIAGNOSIS — R7303 Prediabetes: Secondary | ICD-10-CM | POA: Diagnosis not present

## 2023-06-13 DIAGNOSIS — Z6841 Body Mass Index (BMI) 40.0 and over, adult: Secondary | ICD-10-CM | POA: Diagnosis not present

## 2023-06-13 DIAGNOSIS — I10 Essential (primary) hypertension: Secondary | ICD-10-CM | POA: Diagnosis not present

## 2023-06-13 DIAGNOSIS — J811 Chronic pulmonary edema: Secondary | ICD-10-CM | POA: Diagnosis not present

## 2023-06-13 DIAGNOSIS — R9431 Abnormal electrocardiogram [ECG] [EKG]: Secondary | ICD-10-CM | POA: Insufficient documentation

## 2023-06-13 DIAGNOSIS — J90 Pleural effusion, not elsewhere classified: Secondary | ICD-10-CM | POA: Diagnosis not present

## 2023-06-13 DIAGNOSIS — I509 Heart failure, unspecified: Secondary | ICD-10-CM | POA: Insufficient documentation

## 2023-06-13 DIAGNOSIS — N19 Unspecified kidney failure: Secondary | ICD-10-CM | POA: Diagnosis not present

## 2023-06-13 DIAGNOSIS — I517 Cardiomegaly: Secondary | ICD-10-CM | POA: Diagnosis not present

## 2023-06-13 DIAGNOSIS — I132 Hypertensive heart and chronic kidney disease with heart failure and with stage 5 chronic kidney disease, or end stage renal disease: Secondary | ICD-10-CM | POA: Insufficient documentation

## 2023-06-14 ENCOUNTER — Other Ambulatory Visit: Payer: Self-pay

## 2023-06-14 ENCOUNTER — Ambulatory Visit
Admission: EM | Admit: 2023-06-14 | Discharge: 2023-06-14 | Disposition: A | Discharge status: Home / Self Care | Attending: Family Medicine | Admitting: Family Medicine

## 2023-06-14 ENCOUNTER — Emergency Department

## 2023-06-14 DIAGNOSIS — E8770 Fluid overload, unspecified: Secondary | ICD-10-CM | POA: Diagnosis not present

## 2023-06-14 DIAGNOSIS — N186 End stage renal disease: Secondary | ICD-10-CM

## 2023-06-14 DIAGNOSIS — R0602 Shortness of breath: Secondary | ICD-10-CM | POA: Diagnosis not present

## 2023-06-14 DIAGNOSIS — D631 Anemia in chronic kidney disease: Secondary | ICD-10-CM | POA: Diagnosis not present

## 2023-06-14 DIAGNOSIS — D638 Anemia in other chronic diseases classified elsewhere: Secondary | ICD-10-CM

## 2023-06-14 DIAGNOSIS — E66813 Obesity, class 3: Secondary | ICD-10-CM | POA: Diagnosis not present

## 2023-06-14 DIAGNOSIS — I10 Essential (primary) hypertension: Secondary | ICD-10-CM | POA: Diagnosis not present

## 2023-06-14 DIAGNOSIS — I160 Hypertensive urgency: Secondary | ICD-10-CM

## 2023-06-14 DIAGNOSIS — N179 Acute kidney failure, unspecified: Secondary | ICD-10-CM | POA: Diagnosis not present

## 2023-06-14 DIAGNOSIS — J90 Pleural effusion, not elsewhere classified: Secondary | ICD-10-CM | POA: Diagnosis not present

## 2023-06-14 DIAGNOSIS — Z992 Dependence on renal dialysis: Secondary | ICD-10-CM | POA: Diagnosis not present

## 2023-06-14 DIAGNOSIS — Z6841 Body Mass Index (BMI) 40.0 and over, adult: Secondary | ICD-10-CM

## 2023-06-14 DIAGNOSIS — I509 Heart failure, unspecified: Secondary | ICD-10-CM

## 2023-06-14 DIAGNOSIS — N189 Chronic kidney disease, unspecified: Secondary | ICD-10-CM | POA: Insufficient documentation

## 2023-06-14 DIAGNOSIS — J9600 Acute respiratory failure, unspecified whether with hypoxia or hypercapnia: Secondary | ICD-10-CM | POA: Diagnosis not present

## 2023-06-14 DIAGNOSIS — G4733 Obstructive sleep apnea (adult) (pediatric): Secondary | ICD-10-CM | POA: Diagnosis not present

## 2023-06-14 DIAGNOSIS — I517 Cardiomegaly: Secondary | ICD-10-CM | POA: Diagnosis not present

## 2023-06-14 DIAGNOSIS — J811 Chronic pulmonary edema: Secondary | ICD-10-CM | POA: Diagnosis not present

## 2023-06-14 LAB — CBC WITH DIFFERENTIAL/PLATELET
Abs Immature Granulocytes: 0.03 10*3/uL (ref 0.00–0.07)
Basophils Absolute: 0 10*3/uL (ref 0.0–0.1)
Basophils Relative: 0 %
Eosinophils Absolute: 0 10*3/uL (ref 0.0–0.5)
Eosinophils Relative: 0 %
HCT: 25.2 % — ABNORMAL LOW (ref 36.0–46.0)
Hemoglobin: 8.2 g/dL — ABNORMAL LOW (ref 12.0–15.0)
Immature Granulocytes: 0 %
Lymphocytes Relative: 21 %
Lymphs Abs: 2 10*3/uL (ref 0.7–4.0)
MCH: 28 pg (ref 26.0–34.0)
MCHC: 32.5 g/dL (ref 30.0–36.0)
MCV: 86 fL (ref 80.0–100.0)
Monocytes Absolute: 0.3 10*3/uL (ref 0.1–1.0)
Monocytes Relative: 4 %
Neutro Abs: 6.8 10*3/uL (ref 1.7–7.7)
Neutrophils Relative %: 75 %
Platelets: 285 10*3/uL (ref 150–400)
RBC: 2.93 MIL/uL — ABNORMAL LOW (ref 3.87–5.11)
RDW: 12.6 % (ref 11.5–15.5)
WBC: 9.2 10*3/uL (ref 4.0–10.5)
nRBC: 0 % (ref 0.0–0.2)

## 2023-06-14 LAB — TROPONIN I (HIGH SENSITIVITY)
Troponin I (High Sensitivity): 14 ng/L (ref <18)
Troponin I (High Sensitivity): 16 ng/L (ref <18)

## 2023-06-14 LAB — RETICULOCYTES
Immature Retic Fract: 13.7 % (ref 2.3–15.9)
RBC.: 2.51 MIL/uL — ABNORMAL LOW (ref 3.87–5.11)
Retic Count, Absolute: 40.4 10*3/uL (ref 19.0–186.0)
Retic Ct Pct: 1.6 % (ref 0.4–3.1)

## 2023-06-14 LAB — BRAIN NATRIURETIC PEPTIDE: B Natriuretic Peptide: 1313.6 pg/mL — ABNORMAL HIGH (ref 0.0–100.0)

## 2023-06-14 LAB — COMPREHENSIVE METABOLIC PANEL WITH GFR
ALT: 12 U/L (ref 0–44)
AST: 14 U/L — ABNORMAL LOW (ref 15–41)
Albumin: 2.9 g/dL — ABNORMAL LOW (ref 3.5–5.0)
Alkaline Phosphatase: 42 U/L (ref 38–126)
Anion gap: 12 (ref 5–15)
BUN: 35 mg/dL — ABNORMAL HIGH (ref 6–20)
CO2: 22 mmol/L (ref 22–32)
Calcium: 7.2 mg/dL — ABNORMAL LOW (ref 8.9–10.3)
Chloride: 104 mmol/L (ref 98–111)
Creatinine, Ser: 11.41 mg/dL — ABNORMAL HIGH (ref 0.44–1.00)
GFR, Estimated: 4 mL/min — ABNORMAL LOW (ref >60)
Glucose, Bld: 103 mg/dL — ABNORMAL HIGH (ref 70–99)
Potassium: 4 mmol/L (ref 3.5–5.1)
Sodium: 138 mmol/L (ref 135–145)
Total Bilirubin: 0.7 mg/dL (ref 0.0–1.2)
Total Protein: 7.1 g/dL (ref 6.5–8.1)

## 2023-06-14 LAB — RESP PANEL BY RT-PCR (RSV, FLU A&B, COVID)  RVPGX2
Influenza A by PCR: NEGATIVE
Influenza B by PCR: NEGATIVE
Resp Syncytial Virus by PCR: NEGATIVE
SARS Coronavirus 2 by RT PCR: NEGATIVE

## 2023-06-14 LAB — IRON AND TIBC
Iron: 42 ug/dL (ref 28–170)
Saturation Ratios: 20 % (ref 10.4–31.8)
TIBC: 206 ug/dL — ABNORMAL LOW (ref 250–450)
UIBC: 164 ug/dL

## 2023-06-14 LAB — VITAMIN B12: Vitamin B-12: 312 pg/mL (ref 180–914)

## 2023-06-14 LAB — HIV ANTIBODY (ROUTINE TESTING W REFLEX): HIV Screen 4th Generation wRfx: NONREACTIVE

## 2023-06-14 LAB — FOLATE: Folate: 18.4 ng/mL (ref >5.9)

## 2023-06-14 LAB — FERRITIN: Ferritin: 99 ng/mL (ref 11–307)

## 2023-06-14 MED ORDER — ONDANSETRON HCL 4 MG/2ML IJ SOLN
4.0000 mg | Freq: Four times a day (QID) | INTRAMUSCULAR | Status: DC | PRN
Start: 2023-06-14 — End: 2023-06-14

## 2023-06-14 MED ORDER — CHLORHEXIDINE GLUCONATE CLOTH 2 % EX PADS
6.0000 | MEDICATED_PAD | Freq: Every day | CUTANEOUS | Status: DC
  Filled 2023-06-14: qty 6

## 2023-06-14 MED ORDER — FUROSEMIDE 10 MG/ML IJ SOLN
80.0000 mg | Freq: Once | INTRAMUSCULAR | Status: AC
  Administered 2023-06-14: 80 mg via INTRAVENOUS
  Filled 2023-06-14: qty 8

## 2023-06-14 MED ORDER — NICARDIPINE HCL IN NACL 20-0.86 MG/200ML-% IV SOLN
3.0000 mg/h | INTRAVENOUS | Status: DC
  Administered 2023-06-14: 15 mg/h via INTRAVENOUS
  Administered 2023-06-14: 5 mg/h via INTRAVENOUS
  Filled 2023-06-14 (×2): qty 200

## 2023-06-14 MED ORDER — IRBESARTAN 150 MG PO TABS
150.0000 mg | ORAL_TABLET | Freq: Every day | ORAL | Status: DC
  Administered 2023-06-14: 150 mg via ORAL
  Filled 2023-06-14: qty 1

## 2023-06-14 MED ORDER — CLONIDINE HCL 0.1 MG PO TABS
0.2000 mg | ORAL_TABLET | Freq: Three times a day (TID) | ORAL | Status: DC
  Administered 2023-06-14 (×2): 0.2 mg via ORAL
  Filled 2023-06-14 (×2): qty 2

## 2023-06-14 MED ORDER — ONDANSETRON HCL 4 MG PO TABS: 4.0000 mg | ORAL_TABLET | Freq: Four times a day (QID) | ORAL | Status: DC | PRN

## 2023-06-14 MED ORDER — ACETAMINOPHEN 325 MG PO TABS
650.0000 mg | ORAL_TABLET | Freq: Once | ORAL | Status: AC
  Administered 2023-06-14: 650 mg via ORAL
  Filled 2023-06-14: qty 2

## 2023-06-14 MED ORDER — NITROGLYCERIN IN D5W 200-5 MCG/ML-% IV SOLN
0.0000 ug/min | INTRAVENOUS | Status: DC
  Administered 2023-06-14: 5 ug/min via INTRAVENOUS
  Filled 2023-06-14: qty 250

## 2023-06-14 MED ORDER — ACETAMINOPHEN 325 MG PO TABS
ORAL_TABLET | ORAL | Status: AC
  Filled 2023-06-14: qty 2

## 2023-06-14 MED ORDER — ONDANSETRON HCL 4 MG/2ML IJ SOLN
INTRAMUSCULAR | Status: AC
  Filled 2023-06-14: qty 2

## 2023-06-14 MED ORDER — MORPHINE SULFATE (PF) 2 MG/ML IV SOLN
2.0000 mg | Freq: Once | INTRAVENOUS | Status: DC
  Filled 2023-06-14: qty 1

## 2023-06-14 MED ORDER — ACETAMINOPHEN 325 MG PO TABS
650.0000 mg | ORAL_TABLET | Freq: Four times a day (QID) | ORAL | Status: DC | PRN
  Administered 2023-06-14: 650 mg via ORAL

## 2023-06-14 MED ORDER — HEPARIN SODIUM (PORCINE) 5000 UNIT/ML IJ SOLN
5000.0000 [IU] | Freq: Three times a day (TID) | INTRAMUSCULAR | Status: DC
  Administered 2023-06-14 (×2): 5000 [IU] via SUBCUTANEOUS
  Filled 2023-06-14 (×2): qty 1

## 2023-06-14 MED ORDER — ONDANSETRON HCL 4 MG/2ML IJ SOLN
4.0000 mg | Freq: Once | INTRAMUSCULAR | Status: AC
  Administered 2023-06-14: 4 mg via INTRAVENOUS

## 2023-06-14 MED ORDER — HYDRALAZINE HCL 10 MG PO TABS: 10.0000 mg | ORAL_TABLET | Freq: Two times a day (BID) | ORAL | 3 refills | Status: DC

## 2023-06-14 MED ORDER — CLONIDINE HCL 0.2 MG PO TABS: 0.2000 mg | ORAL_TABLET | Freq: Three times a day (TID) | ORAL | 11 refills | Status: AC

## 2023-06-14 MED ORDER — EPOETIN ALFA-EPBX 10000 UNIT/ML IJ SOLN
10000.0000 [IU] | Freq: Once | INTRAMUSCULAR | Status: AC
  Administered 2023-06-14: 10000 [IU] via INTRAVENOUS
  Filled 2023-06-14: qty 1

## 2023-06-14 NOTE — ED Triage Notes (Signed)
 [facility] with CC of SOB x1 day. Missed dialysis this am due to going to scheduled drs apt. A&O x4. Hypertensive.

## 2023-06-14 NOTE — ED Notes (Signed)
 First nurse note- pt brought in via ems from home.  Pt missed dialysis today.  Pt has sob x 2 days.  Pt is hypertensive and swollen.  Bp 190/100 per ems.  Pt alert.

## 2023-06-14 NOTE — Discharge Summary (Signed)
 Physician Discharge Summary   Patient: Sierra Lee MRN: [id number] DOB: [birth year]  Admit date:     06/14/2023  Discharge date: 06/14/23  Discharge Physician: Floydene Flock   PCP: Ronnald Ramp, MD   Recommendations at discharge:    Pt had full HD cession today with plan for resuming of regular HD schedule tomorrow. SOB and volume overload has clinically resolved.  HTN- SBP improved to the 150s s/p HD. Noted use of cardene and NTG gtt to improve systolic pressures to 120s. Will plan to increase clonidine to 0.2mg  TID dosing in conjunction w/ current hypertensive regimen.   Discharge Diagnoses: Principal Problem:   Acute CHF (HCC) Active Problems:   Volume overload   Hypertensive urgency   Morbid obesity with BMI of 50.0-59.9, adult (HCC)   Severe obstructive sleep apnea   Anemia of renal disease  Resolved Problems:   * No resolved hospital problems. [facility] Course: No notes on file  Assessment and Plan: Volume overload Acute volume overload in the setting of ESRD on hemodialysis Monday Wednesday Friday-missed session Positive cardiomegaly and interstitial edema on chest x-ray Anasarcic  body habitus Tentative plan for hemodialysis today Does still make some urine-status post 80 mg IV Lasix in the ER Follow-up nephrology recommendations.  Hypertensive urgency ESRD on HD MWF  Blood pressure 190s/120s on presentation In discussion with patient and mother, this is a chronic/recurrent issue Started on Cardene as well as nitroglycerin drip with SBP's improving into the 110s Will titrate regimen in conjunction with resuming oral BP regimen Monitor blood pressure with dialysis     Anemia of renal disease Hgb 8.2 today w/ baseline hgb around 11 in setting of ESRD on HD  No active bleeding Will check anemia panel  Trend  Transfuse for hgb <7  Follow    Severe obstructive sleep apnea CPAP  Morbid obesity with BMI of 50.0-59.9, adult (HCC) BMI 55   Overall major confounding issue           Consultants: Nephrology  Procedures performed: HD  Disposition: Home Diet recommendation:  Renal diet DISCHARGE MEDICATION:   Discharge Exam: Filed Weights   06/14/23 0028  Weight: (!) 150.1 kg   Unchanged from admission evaluation.   Condition at discharge: stable  The results of significant diagnostics from this hospitalization (including imaging, microbiology, ancillary and laboratory) are listed below for reference.   Imaging Studies: DG Chest Port 1 View Result Date: 06/14/2023 CLINICAL DATA:  Shortness of breath after missing dialysis appointment today. EXAM: PORTABLE CHEST 1 VIEW COMPARISON:  None Available. FINDINGS: Right IJ dialysis catheter with distal split lumens terminates at the superior cavoatrial junction. There is mild-to-moderate cardiomegaly. There is central vascular fullness and slight interstitial edema in the lung bases with small pleural effusions. The lungs are clear of focal infiltrates. The mediastinum is normally outlined. Thoracic cage intact. IMPRESSION: 1. Cardiomegaly with central vascular fullness and slight interstitial edema in the lung bases with small pleural effusions. Findings are consistent with mild CHF. 2. Right IJ dialysis catheter terminates at the superior cavoatrial junction. Electronically Signed   By: Almira Bar M.D.   On: 06/14/2023 00:57    Microbiology: Results for orders placed or performed during the hospital encounter of 06/14/23  Resp panel by RT-PCR (RSV, Flu A&B, Covid) Anterior Nasal Swab     Status: None   Collection Time: 06/14/23 12:43 AM   Specimen: Anterior Nasal Swab  Result Value Ref Range Status   SARS Coronavirus 2 by RT  PCR NEGATIVE NEGATIVE Final    Comment: (NOTE) SARS-CoV-2 target nucleic acids are NOT DETECTED.  The SARS-CoV-2 RNA is generally detectable in upper respiratory specimens during the acute phase of infection. The lowest concentration of  SARS-CoV-2 viral copies this assay can detect is 138 copies/mL. A negative result does not preclude SARS-Cov-2 infection and should not be used as the sole basis for treatment or other patient management decisions. A negative result may occur with  improper specimen collection/handling, submission of specimen other than nasopharyngeal swab, presence of viral mutation(s) within the areas targeted by this assay, and inadequate number of viral copies(<138 copies/mL). A negative result must be combined with clinical observations, patient history, and epidemiological information. The expected result is Negative.  Fact Sheet for Patients:  BloggerCourse.com  Fact Sheet for Healthcare Providers:  SeriousBroker.it  This test is no t yet approved or cleared by the [place] FDA and  has been authorized for detection and/or diagnosis of SARS-CoV-2 by FDA under an Emergency Use Authorization (EUA). This EUA will remain  in effect (meaning this test can be used) for the duration of the COVID-19 declaration under Section 564(b)(1) of the Act, 21 U.S.C.section 360bbb-3(b)(1), unless the authorization is terminated  or revoked sooner.       Influenza A by PCR NEGATIVE NEGATIVE Final   Influenza B by PCR NEGATIVE NEGATIVE Final    Comment: (NOTE) The Xpert Xpress SARS-CoV-2/FLU/RSV plus assay is intended as an aid in the diagnosis of influenza from Nasopharyngeal swab specimens and should not be used as a sole basis for treatment. Nasal washings and aspirates are unacceptable for Xpert Xpress SARS-CoV-2/FLU/RSV testing.  Fact Sheet for Patients: BloggerCourse.com  Fact Sheet for Healthcare Providers: SeriousBroker.it  This test is not yet approved or cleared by the [place] FDA and has been authorized for detection and/or diagnosis of SARS-CoV-2 by FDA under an Emergency Use  Authorization (EUA). This EUA will remain in effect (meaning this test can be used) for the duration of the COVID-19 declaration under Section 564(b)(1) of the Act, 21 U.S.C. section 360bbb-3(b)(1), unless the authorization is terminated or revoked.     Resp Syncytial Virus by PCR NEGATIVE NEGATIVE Final    Comment: (NOTE) Fact Sheet for Patients: BloggerCourse.com  Fact Sheet for Healthcare Providers: SeriousBroker.it  This test is not yet approved or cleared by the [place] FDA and has been authorized for detection and/or diagnosis of SARS-CoV-2 by FDA under an Emergency Use Authorization (EUA). This EUA will remain in effect (meaning this test can be used) for the duration of the COVID-19 declaration under Section 564(b)(1) of the Act, 21 U.S.C. section 360bbb-3(b)(1), unless the authorization is terminated or revoked.  Performed at [facility], [address] [place], [place] [address]     Labs: CBC: Recent Labs  Lab 06/14/23 0043  WBC 9.2  NEUTROABS 6.8  HGB 8.2*  HCT 25.2*  MCV 86.0  PLT 285   Basic Metabolic Panel: Recent Labs  Lab 06/14/23 0043  NA 138  K 4.0  CL 104  CO2 22  GLUCOSE 103*  BUN 35*  CREATININE 11.41*  CALCIUM 7.2*   Liver Function Tests: Recent Labs  Lab 06/14/23 0043  AST 14*  ALT 12  ALKPHOS 42  BILITOT 0.7  PROT 7.1  ALBUMIN 2.9*   CBG: No results for input(s): "GLUCAP" in the last 168 hours.  Discharge time spent: less than 30 minutes.  Signed: Floydene Flock, MD [facility] 06/14/2023

## 2023-06-14 NOTE — ED Notes (Signed)
 Pt transported to dialysis by Rosey Bath, Charity fundraiser.

## 2023-06-14 NOTE — Assessment & Plan Note (Addendum)
 CPAP.

## 2023-06-14 NOTE — ED Notes (Signed)
 Paper copy for dialysis consent sign by patient at this time.

## 2023-06-14 NOTE — Assessment & Plan Note (Signed)
 BMI 55  Overall major confounding issue

## 2023-06-14 NOTE — H&P (Signed)
 History and Physical    Patient: Sierra Lee WUJ:[id number] DOB: 1990-05-11 DOA: 06/14/2023 DOS: the patient was seen and examined on 06/14/2023 PCP: Ronnald Ramp, MD  Patient coming from: Home  Chief Complaint:  Chief Complaint  Patient presents with   Shortness of Breath   HPI: Sierra Lee is a 33 y.o. female with medical history significant of morbid obesity, hypertension, OSA on CPAP, ESRD on hemodialysis Monday Wednesday Friday presenting with volume overload, hypertensive urgency.  Patient reports missing hemodialysis session yesterday.  Patient states she had appointment with the [facility] system for potential renal transplant evaluation.  Last dialysis session was roughly 3 and half days ago.  Has had worsening orthopnea, PND, shortness of breath.  Minimal chest pain.  No abdominal pain nausea or vomiting.  Noted baseline hypertension is fairly poorly controlled.  Is on multiple medications including clonidine, Norvasc, ARB blood pressure is maintaining usually in the 170s to 180s.  Patient reports compliance with regimen.  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  Gets dialysis in the [facility] system. Presented to the ER afebrile, heart rate 100s, blood pressure 180s to 190s over 110s.  Satting well on room air.  White count 9.2, hemoglobin 8.2, platelets 285, troponin within normal limits.  Creatinine 11.4.  BNP 1313.  COVID flu and RSV negative.  Chest x-ray with cardiomegaly and interstitial edema. EKG NSR.   Review of Systems: As mentioned in the history of present illness. All other systems reviewed and are negative. Past Medical History:  Diagnosis Date   Allergy    Anemia    Dialysis patient (HCC)    M, W, F   Dyspnea    ESRD (end stage renal disease) (HCC)    Former cigarette smoker    GERD (gastroesophageal reflux disease)    Hypertension    Morbid obesity (HCC)    OSA on CPAP    Past  Surgical History:  Procedure Laterality Date   CESAREAN SECTION     x3   CESAREAN SECTION WITH BILATERAL TUBAL LIGATION Bilateral 07/16/2019   Procedure: CESAREAN SECTION WITH BILATERAL TUBAL LIGATION;  Surgeon: Hildred Laser, MD;  Location: [facility] ORS;  Service: Obstetrics;  Laterality: Bilateral;  Repeat C-Section   DIALYSIS/PERMA CATHETER INSERTION N/A 04/18/2023   Procedure: DIALYSIS/PERMA CATHETER INSERTION;  Surgeon: Annice Needy, MD;  Location: [facility] INVASIVE CV LAB;  Service: Cardiovascular;  Laterality: N/A;   Social History:  reports that she quit smoking about 4 years ago. Her smoking use included cigarettes. She has never used smokeless tobacco. She reports that she does not currently use alcohol. She reports that she does not use drugs.  Allergies  Allergen Reactions   Other Anaphylaxis, Hives and Rash    Pineapples   Penicillins Hives and Rash   Hydralazine Hcl Nausea And Vomiting    Patient has n/v with IV hydralazine, PO hydralazine is ok.    Family History  Problem Relation Age of Onset   Diabetes Mother    Hypertension Mother    Breast cancer Maternal Aunt    Diabetes Paternal Aunt    Breast cancer Maternal Grandmother     Prior to Admission medications   Medication Sig Start Date End Date Taking? Authorizing Provider  acetaminophen (TYLENOL) 325 MG tablet Take 2 tablets (650 mg total) by mouth every 6 (six) hours as needed for mild pain (pain score 1-3) (or Fever >/= 101). 01/28/23   Sunnie Nielsen, DO  albuterol (  VENTOLIN HFA) 108 (90 Base) MCG/ACT inhaler Inhale 2 puffs into the lungs every 6 (six) hours as needed for wheezing or shortness of breath. 05/30/23   Ostwalt, Edmon Crape, PA-C  amLODipine (NORVASC) 10 MG tablet Take 1 tablet (10 mg total) by mouth daily. Patient taking differently: Take 10 mg by mouth every morning. 01/29/23   Sunnie Nielsen, DO  Blood Pressure Monitoring KIT Use to measure blood pressure as needed for home monitoring 03/17/23    Simmons-Robinson, Tawanna Cooler, MD  calcitRIOL (ROCALTROL) 0.25 MCG capsule Take 0.25 mcg by mouth daily. 03/04/23 03/03/24  [provider]  cloNIDine (CATAPRES) 0.1 MG tablet Take 0.1 mg by mouth 2 (two) times daily. 04/05/23 04/04/24  [provider]  hydrALAZINE (APRESOLINE) 10 MG tablet Take 10 mg by mouth 2 (two) times daily.    [provider]  Multiple Vitamin (MULTIVITAMIN) capsule Take 1 capsule by mouth daily.    [provider]  torsemide (DEMADEX) 100 MG tablet Take 100 mg by mouth every morning.    [provider]  valsartan (DIOVAN) 160 MG tablet Take 160 mg by mouth every morning.    [provider]    Physical Exam: Vitals:   06/14/23 0715 06/14/23 0730 06/14/23 0735 06/14/23 0745  BP: (!) 118/54 114/60 (!) 125/59 117/68  Pulse: (!) 115 (!) 110 (!) 114 (!) 114  Resp: 19 (!) 26 19 (!) 22  Temp:      TempSrc:      SpO2: 100% 97% 99% 99%  Weight:      Height:       Physical Exam Constitutional:      Appearance: She is obese.  HENT:     Head: Normocephalic and atraumatic.     Nose: Nose normal.     Mouth/Throat:     Mouth: Mucous membranes are moist.  Eyes:     Pupils: Pupils are equal, round, and reactive to light.  Cardiovascular:     Rate and Rhythm: Normal rate and regular rhythm.  Pulmonary:     Effort: Pulmonary effort is normal.  Abdominal:     General: Bowel sounds are normal.  Musculoskeletal:        General: Normal range of motion.  Skin:    General: Skin is warm.  Neurological:     General: No focal deficit present.  Psychiatric:        Mood and Affect: Mood normal.     Data Reviewed:  There are no new results to review at this time.  DG Chest Port 1 View CLINICAL DATA:  Shortness of breath after missing dialysis appointment today.  EXAM: PORTABLE CHEST 1 VIEW  COMPARISON:  None Available.  FINDINGS: Right IJ dialysis catheter with distal split lumens terminates at the superior  cavoatrial junction.  There is mild-to-moderate cardiomegaly. There is central vascular fullness and slight interstitial edema in the lung bases with small pleural effusions.  The lungs are clear of focal infiltrates. The mediastinum is normally outlined. Thoracic cage intact.  IMPRESSION: 1. Cardiomegaly with central vascular fullness and slight interstitial edema in the lung bases with small pleural effusions. Findings are consistent with mild CHF. 2. Right IJ dialysis catheter terminates at the superior cavoatrial junction.  Electronically Signed   By: Almira Bar M.D.   On: 06/14/2023 00:57  Lab Results  Component Value Date   WBC 9.2 06/14/2023   HGB 8.2 (L) 06/14/2023   HCT 25.2 (L) 06/14/2023   MCV 86.0 06/14/2023   PLT 285  06/14/2023   Last metabolic panel Lab Results  Component Value Date   GLUCOSE 103 (H) 06/14/2023   NA 138 06/14/2023   K 4.0 06/14/2023   CL 104 06/14/2023   CO2 22 06/14/2023   BUN 35 (H) 06/14/2023   CREATININE 11.41 (H) 06/14/2023   GFRNONAA 4 (L) 06/14/2023   CALCIUM 7.2 (L) 06/14/2023   PHOS 6.6 (H) 03/17/2023   PROT 7.1 06/14/2023   ALBUMIN 2.9 (L) 06/14/2023   LABGLOB 2.7 02/02/2023   AGRATIO 1.0 (L) 08/18/2022   BILITOT 0.7 06/14/2023   ALKPHOS 42 06/14/2023   AST 14 (L) 06/14/2023   ALT 12 06/14/2023   ANIONGAP 12 06/14/2023    Assessment and Plan: Volume overload Acute volume overload in the setting of ESRD on hemodialysis Monday Wednesday Friday-missed session Positive cardiomegaly and interstitial edema on chest x-ray Anasarcic  body habitus Tentative plan for hemodialysis today Does still make some urine-status post 80 mg IV Lasix in the ER Follow-up nephrology recommendations.  Hypertensive urgency ESRD on HD MWF  Blood pressure 190s/120s on presentation In discussion with patient and mother, this is a chronic/recurrent issue Started on Cardene as well as nitroglycerin drip with SBP's improving into the  110s Will titrate regimen in conjunction with resuming oral BP regimen Monitor blood pressure with dialysis     Severe obstructive sleep apnea CPAP  Morbid obesity with BMI of 50.0-59.9, adult (HCC) BMI 55  Overall major confounding issue        Advance Care Planning:   Code Status: Full Code   Consults: Nephrology   Family Communication: Mother at the bedside   Severity of Illness: The appropriate patient status for this patient is OBSERVATION. Observation status is judged to be reasonable and necessary in order to provide the required intensity of service to ensure the patient's safety. The patient's presenting symptoms, physical exam findings, and initial radiographic and laboratory data in the context of their medical condition is felt to place them at decreased risk for further clinical deterioration. Furthermore, it is anticipated that the patient will be medically stable for discharge from the hospital within 2 midnights of admission.   Author: Floydene Flock, MD 06/14/2023 8:24 AM  For on call review [email or web address].

## 2023-06-14 NOTE — Assessment & Plan Note (Addendum)
 ESRD on HD MWF  Blood pressure 190s/120s on presentation In discussion with patient and mother, this is a chronic/recurrent issue Started on Cardene as well as nitroglycerin drip with SBP's improving into the 110s Will titrate regimen in conjunction with resuming oral BP regimen Monitor blood pressure with dialysis

## 2023-06-14 NOTE — Assessment & Plan Note (Signed)
 Hgb 8.2 today w/ baseline hgb around 11 in setting of ESRD on HD  No active bleeding Will check anemia panel  Trend  Transfuse for hgb <7  Follow

## 2023-06-14 NOTE — ED Notes (Signed)
   06/14/23 0545  Vitals  BP (!) 151/98  MAP (mmHg) 114  Pulse Rate (!) 109  ECG Heart Rate (!) 113  Resp (!) 31  MEWS COLOR  MEWS Score Color Red  Oxygen Therapy  SpO2 99 %  MEWS Score  MEWS Temp 0  MEWS Systolic 0  MEWS Pulse 2  MEWS RR 2  MEWS LOC 0  MEWS Score 4   Provider made aware of tachycardia.

## 2023-06-14 NOTE — ED Notes (Signed)
 This RN communicated to [facility] MD that titration of nitroglycerin is not improving pt's blood pressure. Dolores Frame MD verbalizes understanding and agrees. See new orders.

## 2023-06-14 NOTE — ED Notes (Signed)
 This RN gave report to [employer] and performed care handoff. Call light in reach, bed wheels locked, side rail raised, pt updated on plan of care. Rounding completed.

## 2023-06-14 NOTE — ED Notes (Signed)
Report given to dialysis at this time. 

## 2023-06-14 NOTE — Assessment & Plan Note (Addendum)
 Acute volume overload in the setting of ESRD on hemodialysis Monday Wednesday Friday-missed session Positive cardiomegaly and interstitial edema on chest x-ray Anasarcic  body habitus Tentative plan for hemodialysis today Does still make some urine-status post 80 mg IV Lasix in the ER Follow-up nephrology recommendations.

## 2023-06-14 NOTE — ED Notes (Signed)
 Ice chips given to pt.

## 2023-06-14 NOTE — ED Notes (Signed)
 Pt given crackers.

## 2023-06-14 NOTE — Progress Notes (Signed)
 Hemodialysis Note:  Received patient in bed to unit. Alert and oriented. Informed consent singed and in chart.  Treatment initiated: 1210 Treatment completed: 1615  Access used: Right internal jugular catheter Access issues: None  Patient tolerated well. Transported back to room, alert without acute distress. Report given to patient's RN.  Total UF removed: 2 Liters Medications given: Tylenol 650 mg Tablet, Retacrit 96045 units IV  Post HD weight: unable to weight, bed scale not working  [facility]

## 2023-06-14 NOTE — ED Notes (Signed)
 This RN introduced self to pt. Call light in reach, bed wheels locked, side rail raised, pt updated on plan of care. Rounding completed.

## 2023-06-14 NOTE — Progress Notes (Signed)
 [facility]  ROUNDING NOTE   Subjective:   Sierra Lee is a 33 y.o. with past medical conditions including hypertension,  OSA with CPAP, prediabetes, and acute kidney injury on hemodialysis. Patient presents to ED with shortness of breath and has been admitted for Shortness of breath [R06.02] Acute CHF (HCC) [I50.9] Hypertensive urgency [I16.0] Hypertension, unspecified type [I10] Acute on chronic congestive heart failure, unspecified heart failure type [facility]) [I50.9]  Patient is known our practice and receives outpatient dialysis treatments at [facility] on a MWF schedule, supervised by Dr. Wynelle Link.  Patient states she began dialysis in February.  States she missed yesterday's treatment due to 2 scheduled appointments.  When she attempted to reschedule missed dialysis treatment, no chair available.  Currently seen resting on stretcher, room air.  Reports shortness of breath on exertion.  States she has been taking all her blood pressure medications.  Blood pressure elevated on ED arrival.  Labs on ED arrival significant for creatinine 11.41 with GFR 4, albumin 2.9, BNP greater than 1300, hemoglobin 8.2.  Respiratory panel negative for influenza, COVID-19, and RSV.  Chest x-ray shows interstitial edema.  We have been consulted to manage dialysis needs during this admission.    Objective:  Vital signs in last 24 hours:  Temp:  [98.1 F (36.7 C)-98.6 F (37 C)] 98.1 F (36.7 C) (04/01 1158) Pulse Rate:  [74-122] 83 (04/01 1500) Resp:  [0-43] 23 (04/01 1500) BP: (114-191)/(49-121) 147/102 (04/01 1500) SpO2:  [89 %-100 %] 98 % (04/01 1500) Weight:  [150.1 kg] 150.1 kg (04/01 0028)  Weight change:  Filed Weights   06/14/23 0028  Weight: (!) 150.1 kg    Intake/Output: No intake/output data recorded.   Intake/Output this shift:  Total I/O In: 123.5 [I.V.:123.5] Out: -   Physical Exam: General: NAD  Head: Normocephalic, atraumatic. Moist oral mucosal  membranes  Eyes: Anicteric  Lungs:  basilar crackles, room air  Heart: Regular rate and rhythm  Abdomen:  Soft, nontender,   Extremities: Trace peripheral edema.  Neurologic: Nonfocal, moving all four extremities  Skin: No lesions  Access: Right IJ tunnel catheter    Basic Metabolic Panel: Recent Labs  Lab 06/14/23 0043  NA 138  K 4.0  CL 104  CO2 22  GLUCOSE 103*  BUN 35*  CREATININE 11.41*  CALCIUM 7.2*    Liver Function Tests: Recent Labs  Lab 06/14/23 0043  AST 14*  ALT 12  ALKPHOS 42  BILITOT 0.7  PROT 7.1  ALBUMIN 2.9*   No results for input(s): "LIPASE", "AMYLASE" in the last 168 hours. No results for input(s): "AMMONIA" in the last 168 hours.  CBC: Recent Labs  Lab 06/14/23 0043  WBC 9.2  NEUTROABS 6.8  HGB 8.2*  HCT 25.2*  MCV 86.0  PLT 285    Cardiac Enzymes: No results for input(s): "CKTOTAL", "CKMB", "CKMBINDEX", "TROPONINI" in the last 168 hours.  BNP: Invalid input(s): "POCBNP"  CBG: No results for input(s): "GLUCAP" in the last 168 hours.  Microbiology: Results for orders placed or performed during the hospital encounter of 06/14/23  Resp panel by RT-PCR (RSV, Flu A&B, Covid) Anterior Nasal Swab     Status: None   Collection Time: 06/14/23 12:43 AM   Specimen: Anterior Nasal Swab  Result Value Ref Range Status   SARS Coronavirus 2 by RT PCR NEGATIVE NEGATIVE Final    Comment: (NOTE) SARS-CoV-2 target nucleic acids are NOT DETECTED.  The SARS-CoV-2 RNA is generally detectable in upper respiratory specimens  during the acute phase of infection. The lowest concentration of SARS-CoV-2 viral copies this assay can detect is 138 copies/mL. A negative result does not preclude SARS-Cov-2 infection and should not be used as the sole basis for treatment or other patient management decisions. A negative result may occur with  improper specimen collection/handling, submission of specimen other than nasopharyngeal swab, presence of viral  mutation(s) within the areas targeted by this assay, and inadequate number of viral copies(<138 copies/mL). A negative result must be combined with clinical observations, patient history, and epidemiological information. The expected result is Negative.  Fact Sheet for Patients:  BloggerCourse.com  Fact Sheet for Healthcare Providers:  SeriousBroker.it  This test is no t yet approved or cleared by the [place] FDA and  has been authorized for detection and/or diagnosis of SARS-CoV-2 by FDA under an Emergency Use Authorization (EUA). This EUA will remain  in effect (meaning this test can be used) for the duration of the COVID-19 declaration under Section 564(b)(1) of the Act, 21 U.S.C.section 360bbb-3(b)(1), unless the authorization is terminated  or revoked sooner.       Influenza A by PCR NEGATIVE NEGATIVE Final   Influenza B by PCR NEGATIVE NEGATIVE Final    Comment: (NOTE) The Xpert Xpress SARS-CoV-2/FLU/RSV plus assay is intended as an aid in the diagnosis of influenza from Nasopharyngeal swab specimens and should not be used as a sole basis for treatment. Nasal washings and aspirates are unacceptable for Xpert Xpress SARS-CoV-2/FLU/RSV testing.  Fact Sheet for Patients: BloggerCourse.com  Fact Sheet for Healthcare Providers: SeriousBroker.it  This test is not yet approved or cleared by the [place] FDA and has been authorized for detection and/or diagnosis of SARS-CoV-2 by FDA under an Emergency Use Authorization (EUA). This EUA will remain in effect (meaning this test can be used) for the duration of the COVID-19 declaration under Section 564(b)(1) of the Act, 21 U.S.C. section 360bbb-3(b)(1), unless the authorization is terminated or revoked.     Resp Syncytial Virus by PCR NEGATIVE NEGATIVE Final    Comment: (NOTE) Fact Sheet for  Patients: BloggerCourse.com  Fact Sheet for Healthcare Providers: SeriousBroker.it  This test is not yet approved or cleared by the [place] FDA and has been authorized for detection and/or diagnosis of SARS-CoV-2 by FDA under an Emergency Use Authorization (EUA). This EUA will remain in effect (meaning this test can be used) for the duration of the COVID-19 declaration under Section 564(b)(1) of the Act, 21 U.S.C. section 360bbb-3(b)(1), unless the authorization is terminated or revoked.  Performed at [facility], [address] [place], [place] [address]     Coagulation Studies: No results for input(s): "LABPROT", "INR" in the last 72 hours.  Urinalysis: No results for input(s): "COLORURINE", "LABSPEC", "PHURINE", "GLUCOSEU", "HGBUR", "BILIRUBINUR", "KETONESUR", "PROTEINUR", "UROBILINOGEN", "NITRITE", "LEUKOCYTESUR" in the last 72 hours.  Invalid input(s): "APPERANCEUR"    Imaging: DG Chest Port 1 View Result Date: 06/14/2023 CLINICAL DATA:  Shortness of breath after missing dialysis appointment today. EXAM: PORTABLE CHEST 1 VIEW COMPARISON:  None Available. FINDINGS: Right IJ dialysis catheter with distal split lumens terminates at the superior cavoatrial junction. There is mild-to-moderate cardiomegaly. There is central vascular fullness and slight interstitial edema in the lung bases with small pleural effusions. The lungs are clear of focal infiltrates. The mediastinum is normally outlined. Thoracic cage intact. IMPRESSION: 1. Cardiomegaly with central vascular fullness and slight interstitial edema in the lung bases with small pleural effusions. Findings are consistent with mild CHF. 2. Right  IJ dialysis catheter terminates at the superior cavoatrial junction. Electronically Signed   By: Almira Bar M.D.   On: 06/14/2023 00:57     Medications:    niCARDipine Stopped (06/14/23 2536)   nitroGLYCERIN Stopped  (06/14/23 1126)    Chlorhexidine Gluconate Cloth  6 each Topical Q0600   cloNIDine  0.2 mg Oral TID   heparin  5,000 Units Subcutaneous Q8H   irbesartan  150 mg Oral Daily   acetaminophen, ondansetron **OR** ondansetron (ZOFRAN) IV  Assessment/ Plan:  Ms. Sierra Lee is a 33 y.o.  female with past medical conditions including hypertension, OSA with CPAP, prediabetes, and acute kidney injury on hemodialysis.  Patient presents to the emergency department with shortness of breath and has been admitted for Shortness of breath [R06.02] Acute CHF (HCC) [I50.9] Hypertensive urgency [I16.0] Hypertension, unspecified type [I10] Acute on chronic congestive heart failure, unspecified heart failure type (HCC) [I50.9]   Acute kidney injury requiring hemodialysis.  Last treatment completed on Friday.  Will provide dialysis today, UF goal 2 to 2.5 L as tolerated.  Next treatment scheduled for Wednesday.  2.  Acute respiratory failure likely secondary to missed dialysis treatment.  Interstitial edema noted on chest x-ray.  Will attempt fluid removal with hemodialysis.  3.  Hypertensive urgency, blood pressure 185/115 on ED arrival.  Patient placed on nicardipine and nitroglycerin drip, has since been weaned.  Patient states compliance with all medications.  Discussed that despite compliance, increased fluid volume does run this risk.  Will continue to monitor.  Blood pressure currently 154/96 during dialysis.  4. Anemia of chronic kidney disease Lab Results  Component Value Date   HGB 8.2 (L) 06/14/2023    Hemoglobin slightly decreased.  Patient does receive Mircera at outpatient clinic.  Will order EPO with dialysis.   LOS: 0 Sierra Lee 4/1/20253:26 PM

## 2023-06-14 NOTE — ED Provider Notes (Signed)
 [facility] Provider Note    Event Date/Time   First MD Initiated Contact with Patient 06/14/23 0030     (approximate)   History   Shortness of Breath   HPI  Sierra Lee is a 33 y.o. female brought to the ED via EMS from home with a chief complaint of shortness of breath.  Patient with a history of nephrotic syndrome on HD M/W/F.  Received full dialysis on Friday.  Missed dialysis today due to medical appointments with renal transplant team and weight loss team.  Arrives to the ED hypertensive with blood pressure 185/115.  Endorses chest tightness.  Denies headache, vision changes, neck pain, abdominal pain, nausea, vomiting or dizziness.  Makes urine several times daily.     Past Medical History   Past Medical History:  Diagnosis Date   Allergy    Anemia    Dialysis patient (HCC)    M, W, F   Dyspnea    ESRD (end stage renal disease) (HCC)    Former cigarette smoker    GERD (gastroesophageal reflux disease)    Hypertension    Morbid obesity (HCC)    OSA on CPAP      Active Problem List   Patient Active Problem List   Diagnosis Date Noted   CKD (chronic kidney disease) stage 4, GFR 15-29 ml/min (HCC) 03/17/2023   Severe obstructive sleep apnea 02/02/2023   GERD with apnea 02/02/2023   Primary hypertension 01/26/2023   Menorrhagia with irregular cycle 08/18/2022   Snoring 08/18/2022   Morbid obesity with BMI of 50.0-59.9, adult (HCC) 05/01/2019     Past Surgical History   Past Surgical History:  Procedure Laterality Date   CESAREAN SECTION     x3   CESAREAN SECTION WITH BILATERAL TUBAL LIGATION Bilateral 07/16/2019   Procedure: CESAREAN SECTION WITH BILATERAL TUBAL LIGATION;  Surgeon: Hildred Laser, MD;  Location: [facility] ORS;  Service: Obstetrics;  Laterality: Bilateral;  Repeat C-Section   DIALYSIS/PERMA CATHETER INSERTION N/A 04/18/2023   Procedure: DIALYSIS/PERMA CATHETER INSERTION;  Surgeon: Annice Needy, MD;  Location: [facility]  INVASIVE CV LAB;  Service: Cardiovascular;  Laterality: N/A;     Home Medications   Prior to Admission medications   Medication Sig Start Date End Date Taking? Authorizing Provider  acetaminophen (TYLENOL) 325 MG tablet Take 2 tablets (650 mg total) by mouth every 6 (six) hours as needed for mild pain (pain score 1-3) (or Fever >/= 101). 01/28/23   Sunnie Nielsen, DO  albuterol (VENTOLIN HFA) 108 (90 Base) MCG/ACT inhaler Inhale 2 puffs into the lungs every 6 (six) hours as needed for wheezing or shortness of breath. 05/30/23   Ostwalt, Edmon Crape, PA-C  amLODipine (NORVASC) 10 MG tablet Take 1 tablet (10 mg total) by mouth daily. Patient taking differently: Take 10 mg by mouth every morning. 01/29/23   Sunnie Nielsen, DO  Blood Pressure Monitoring KIT Use to measure blood pressure as needed for home monitoring 03/17/23   Simmons-Robinson, Tawanna Cooler, MD  calcitRIOL (ROCALTROL) 0.25 MCG capsule Take 0.25 mcg by mouth daily. 03/04/23 03/03/24  [provider]  cloNIDine (CATAPRES) 0.1 MG tablet Take 0.1 mg by mouth 2 (two) times daily. 04/05/23 04/04/24  [provider]  hydrALAZINE (APRESOLINE) 10 MG tablet Take 10 mg by mouth 2 (two) times daily.    [provider]  Multiple Vitamin (MULTIVITAMIN) capsule Take 1 capsule by mouth daily.    [provider]  torsemide (DEMADEX) 100 MG tablet Take 100 mg by  mouth every morning.    [provider]  valsartan (DIOVAN) 160 MG tablet Take 160 mg by mouth every morning.    [provider]     Allergies  Other, Penicillins, and Hydralazine hcl   Family History   Family History  Problem Relation Age of Onset   Diabetes Mother    Hypertension Mother    Breast cancer Maternal Aunt    Diabetes Paternal Aunt    Breast cancer Maternal Grandmother      Physical Exam  Triage Vital Signs: ED Triage Vitals  Encounter Vitals Group     BP 06/14/23 0030 (!) 185/115     Systolic BP Percentile --       Diastolic BP Percentile --      Pulse Rate 06/14/23 0030 98     Resp 06/14/23 0030 19     Temp 06/14/23 0028 98.6 F (37 C)     Temp Source 06/14/23 0028 Oral     SpO2 06/14/23 0030 100 %     Weight 06/14/23 0028 (!) 331 lb (150.1 kg)     Height 06/14/23 0028 5\' 5"  (1.651 m)     Head Circumference --      Peak Flow --      Pain Score 06/14/23 0028 5     Pain Loc --      Pain Education --      Exclude from Growth Chart --     Updated Vital Signs: BP (!) 166/108   Pulse (!) 103   Temp 98.6 F (37 C) (Oral)   Resp 17   Ht 5\' 5"  (1.651 m)   Wt (!) 150.1 kg   SpO2 100%   BMI 55.08 kg/m    General: Awake, moderate distress.  CV:  RRR.  Good peripheral perfusion.  Resp:  Normal effort.  Faint bibasilar rales.  Right chest hemodialysis catheter noted. Abd:  Obese, nontender.  No distention.  Other:  No pedal edema.   ED Results / Procedures / Treatments  Labs (all labs ordered are listed, but only abnormal results are displayed) Labs Reviewed  CBC WITH DIFFERENTIAL/PLATELET - Abnormal; Notable for the following components:      Result Value   RBC 2.93 (*)    Hemoglobin 8.2 (*)    HCT 25.2 (*)    All other components within normal limits  COMPREHENSIVE METABOLIC PANEL WITH GFR - Abnormal; Notable for the following components:   Glucose, Bld 103 (*)    BUN 35 (*)    Creatinine, Ser 11.41 (*)    Calcium 7.2 (*)    Albumin 2.9 (*)    AST 14 (*)    GFR, Estimated 4 (*)    All other components within normal limits  BRAIN NATRIURETIC PEPTIDE - Abnormal; Notable for the following components:   B Natriuretic Peptide 1,313.6 (*)    All other components within normal limits  RESP PANEL BY RT-PCR (RSV, FLU A&B, COVID)  [id number]  TROPONIN I (HIGH SENSITIVITY)  TROPONIN I (HIGH SENSITIVITY)     EKG  ED ECG REPORT I, Elnor Renovato J, the attending physician, personally viewed and interpreted this ECG.   Date: 06/14/2023  EKG Time: 0034  Rate: 93  Rhythm: normal sinus  rhythm  Axis: Normal  Intervals:none  ST&T Change: Nonspecific    RADIOLOGY I have independently visualized and interpreted patient's imaging study as well as noted the radiology interpretation:  Chest x-ray: Cardiomegaly, CHF  Official radiology report(s): DG Chest Port 1  View Result Date: 06/14/2023 CLINICAL DATA:  Shortness of breath after missing dialysis appointment today. EXAM: PORTABLE CHEST 1 VIEW COMPARISON:  None Available. FINDINGS: Right IJ dialysis catheter with distal split lumens terminates at the superior cavoatrial junction. There is mild-to-moderate cardiomegaly. There is central vascular fullness and slight interstitial edema in the lung bases with small pleural effusions. The lungs are clear of focal infiltrates. The mediastinum is normally outlined. Thoracic cage intact. IMPRESSION: 1. Cardiomegaly with central vascular fullness and slight interstitial edema in the lung bases with small pleural effusions. Findings are consistent with mild CHF. 2. Right IJ dialysis catheter terminates at the superior cavoatrial junction. Electronically Signed   By: Almira Bar M.D.   On: 06/14/2023 00:57     PROCEDURES:  Critical Care performed: Yes, see critical care procedure note(s)  CRITICAL CARE Performed by: Irean Hong   Total critical care time: 45 minutes  Critical care time was exclusive of separately billable procedures and treating other patients.  Critical care was necessary to treat or prevent imminent or life-threatening deterioration.  Critical care was time spent personally by me on the following activities: development of treatment plan with patient and/or surrogate as well as nursing, discussions with consultants, evaluation of patient's response to treatment, examination of patient, obtaining history from patient or surrogate, ordering and performing treatments and interventions, ordering and review of laboratory studies, ordering and review of radiographic  studies, pulse oximetry and re-evaluation of patient's condition.   Marland Kitchen1-3 Lead EKG Interpretation  Performed by: Irean Hong, MD Authorized by: Irean Hong, MD     Interpretation: normal     ECG rate:  90   ECG rate assessment: normal     Rhythm: sinus rhythm     Ectopy: none     Conduction: normal   Comments:     Patient placed on cardiac monitor to evaluate for arrhythmias    MEDICATIONS ORDERED IN ED: Medications  nitroGLYCERIN 50 mg in dextrose 5 % 250 mL (0.2 mg/mL) infusion (100 mcg/min Intravenous Rate/Dose Change 06/14/23 0532)  nicardipine (CARDENE) 20mg  in 0.86% saline IV infusion (0.1 mg/ml) (5 mg/hr Intravenous New Bag/Given 06/14/23 0531)  furosemide (LASIX) injection 80 mg (80 mg Intravenous Given 06/14/23 0055)  ondansetron (ZOFRAN) injection 4 mg (0 mg Intravenous Hold 06/14/23 0333)  acetaminophen (TYLENOL) tablet 650 mg (650 mg Oral Given 06/14/23 0353)     IMPRESSION / MDM / ASSESSMENT AND PLAN / ED COURSE  I reviewed the triage vital signs and the nursing notes.                             33 year old female presenting with shortness of breath after missing dialysis appointment. Differential includes, but is not limited to, viral syndrome, bronchitis including COPD exacerbation, pneumonia, reactive airway disease including asthma, CHF including exacerbation with or without pulmonary/interstitial edema, pneumothorax, ACS, thoracic trauma, and pulmonary embolism.  I personally reviewed patient's records and note her nephrology office visit for nephrotic syndrome yesterday.  Patient's presentation is most consistent with acute illness / injury with system symptoms.  The patient is on the cardiac monitor to evaluate for evidence of arrhythmia and/or significant heart rate changes.  Will obtain lab work, chest x-ray.  Administer hefty dose IV Lasix as patient still makes urine several times daily.  That should also help with lowering blood pressure.  Will  reassess.  Clinical Course as of 06/14/23 0535  Tue Jun 14, 2023  0224 Remains hypertensive, very little urine output.  Room air saturation 95%.  Will administer IV medications for additional blood pressure control.  Consult hospitalist services for evaluation and admission. [JS]  0515 Adding Cardene gtt; will titrate up and titrate ntg gtt down [JS]    Clinical Course User Index [JS] Irean Hong, MD     FINAL CLINICAL IMPRESSION(S) / ED DIAGNOSES   Final diagnoses:  Shortness of breath  Hypertension, unspecified type  Hypertensive urgency  Acute on chronic congestive heart failure, unspecified heart failure type (HCC)     Rx / DC Orders   ED Discharge Orders     None        Note:  This document was prepared using Dragon voice recognition software and may include unintentional dictation errors.   Irean Hong, MD 06/14/23 [phone]

## 2023-06-15 ENCOUNTER — Telehealth: Payer: Self-pay

## 2023-06-15 ENCOUNTER — Encounter: Payer: Self-pay | Admitting: Family Medicine

## 2023-06-15 DIAGNOSIS — Z992 Dependence on renal dialysis: Secondary | ICD-10-CM | POA: Diagnosis not present

## 2023-06-15 DIAGNOSIS — N186 End stage renal disease: Secondary | ICD-10-CM | POA: Diagnosis not present

## 2023-06-15 DIAGNOSIS — N179 Acute kidney failure, unspecified: Secondary | ICD-10-CM | POA: Diagnosis not present

## 2023-06-15 DIAGNOSIS — N184 Chronic kidney disease, stage 4 (severe): Secondary | ICD-10-CM | POA: Diagnosis not present

## 2023-06-15 NOTE — Telephone Encounter (Signed)
 Copied from CRM [id number]. Topic: General - Other >> Jun 15, 2023 10:48 AM Macon Large wrote: Reason for CRM: Jethro Bolus a caseworker with [facility] called for an update on the fax that was sent regarding a prior authorization for [facility]. Arline Asp stated that she will fax it again. Call back# [phone]

## 2023-06-15 NOTE — Telephone Encounter (Signed)
 Copied from CRM [id number]. Topic: General - Phone/Fax/Address >> Jun 15, 2023 10:57 AM Antwanette L wrote: Jethro Bolus a caseworker with [facility] is calling to see if we received a fax from 3/25. Cindy sent over a prior authorization for [employer]. >> Jun 15, 2023  3:33 PM Franchot Heidelberg wrote: Arline Asp has tried to fax over this prior auth for [employer] more than five times unsuccessfully. CAL provided Practice admin email to send.   Best contact:   [phone] or [phone]

## 2023-06-15 NOTE — Telephone Encounter (Signed)
 This was placed in Dr. Danella Penton box last week.  Please advise.

## 2023-06-15 NOTE — Telephone Encounter (Signed)
 LMTCB to schedule appointment to discuss weight loss medication. If patient calls back the appointment can be in person or MyChart video visit.

## 2023-06-16 ENCOUNTER — Telehealth: Payer: Self-pay

## 2023-06-16 NOTE — Telephone Encounter (Signed)
 Copied from CRM [id number]. Topic: Appointments - Scheduling Inquiry for Clinic >> Jun 15, 2023  5:59 PM Victorino Dike T wrote: Reason for CRM: gave patient message about scheduling appt for weight loss, she states she has already had 3 appointments and is asking for a call back [phone]

## 2023-06-16 NOTE — Telephone Encounter (Signed)
 LM that prior authorization was received. Patient needs an appointment with provider to discuss weight. Patient last seen for Obesity was 02/02/2023 by Merita Norton. We need an updated visit to be able to provide record to insurance for the PA

## 2023-06-16 NOTE — Telephone Encounter (Signed)
 Agree with office visit to discuss weight loss options for meds and potential SE

## 2023-06-16 NOTE — Telephone Encounter (Signed)
 Copied from CRM [id number]. Topic: General - Phone/Fax/Address >> Jun 15, 2023 10:57 AM Antwanette L wrote: Jethro Bolus a caseworker with [facility] is calling to see if we received a fax from 3/25. Cindy sent over a prior authorization for [employer]. >> Jun 15, 2023  3:33 PM Franchot Heidelberg wrote: Arline Asp has tried to fax over this prior auth for [employer] more than five times unsuccessfully. CAL provided Practice admin email to send.   Best contact:   [phone] or [phone]

## 2023-06-20 DIAGNOSIS — N179 Acute kidney failure, unspecified: Secondary | ICD-10-CM | POA: Diagnosis not present

## 2023-06-20 DIAGNOSIS — N184 Chronic kidney disease, stage 4 (severe): Secondary | ICD-10-CM | POA: Diagnosis not present

## 2023-06-20 DIAGNOSIS — N186 End stage renal disease: Secondary | ICD-10-CM | POA: Diagnosis not present

## 2023-06-20 DIAGNOSIS — Z992 Dependence on renal dialysis: Secondary | ICD-10-CM | POA: Diagnosis not present

## 2023-06-20 NOTE — Telephone Encounter (Signed)
 Can someone please reach out to offer this pt an virtual appt to discuss weight loss.

## 2023-06-22 DIAGNOSIS — N186 End stage renal disease: Secondary | ICD-10-CM | POA: Diagnosis not present

## 2023-06-22 DIAGNOSIS — N184 Chronic kidney disease, stage 4 (severe): Secondary | ICD-10-CM | POA: Diagnosis not present

## 2023-06-22 DIAGNOSIS — N179 Acute kidney failure, unspecified: Secondary | ICD-10-CM | POA: Diagnosis not present

## 2023-06-22 DIAGNOSIS — Z992 Dependence on renal dialysis: Secondary | ICD-10-CM | POA: Diagnosis not present

## 2023-06-24 DIAGNOSIS — N184 Chronic kidney disease, stage 4 (severe): Secondary | ICD-10-CM | POA: Diagnosis not present

## 2023-06-24 DIAGNOSIS — N179 Acute kidney failure, unspecified: Secondary | ICD-10-CM | POA: Diagnosis not present

## 2023-06-24 DIAGNOSIS — Z992 Dependence on renal dialysis: Secondary | ICD-10-CM | POA: Diagnosis not present

## 2023-06-24 DIAGNOSIS — N186 End stage renal disease: Secondary | ICD-10-CM | POA: Diagnosis not present

## 2023-06-27 DIAGNOSIS — Z992 Dependence on renal dialysis: Secondary | ICD-10-CM | POA: Diagnosis not present

## 2023-06-27 DIAGNOSIS — N186 End stage renal disease: Secondary | ICD-10-CM | POA: Diagnosis not present

## 2023-06-27 DIAGNOSIS — N184 Chronic kidney disease, stage 4 (severe): Secondary | ICD-10-CM | POA: Diagnosis not present

## 2023-06-27 DIAGNOSIS — N179 Acute kidney failure, unspecified: Secondary | ICD-10-CM | POA: Diagnosis not present

## 2023-06-29 DIAGNOSIS — N184 Chronic kidney disease, stage 4 (severe): Secondary | ICD-10-CM | POA: Diagnosis not present

## 2023-06-29 DIAGNOSIS — N186 End stage renal disease: Secondary | ICD-10-CM | POA: Diagnosis not present

## 2023-06-29 DIAGNOSIS — N179 Acute kidney failure, unspecified: Secondary | ICD-10-CM | POA: Diagnosis not present

## 2023-06-29 DIAGNOSIS — Z992 Dependence on renal dialysis: Secondary | ICD-10-CM | POA: Diagnosis not present

## 2023-07-01 DIAGNOSIS — N184 Chronic kidney disease, stage 4 (severe): Secondary | ICD-10-CM | POA: Diagnosis not present

## 2023-07-01 DIAGNOSIS — N186 End stage renal disease: Secondary | ICD-10-CM | POA: Diagnosis not present

## 2023-07-01 DIAGNOSIS — N179 Acute kidney failure, unspecified: Secondary | ICD-10-CM | POA: Diagnosis not present

## 2023-07-01 DIAGNOSIS — Z992 Dependence on renal dialysis: Secondary | ICD-10-CM | POA: Diagnosis not present

## 2023-07-04 DIAGNOSIS — G4733 Obstructive sleep apnea (adult) (pediatric): Secondary | ICD-10-CM | POA: Diagnosis not present

## 2023-07-04 DIAGNOSIS — N184 Chronic kidney disease, stage 4 (severe): Secondary | ICD-10-CM | POA: Diagnosis not present

## 2023-07-04 DIAGNOSIS — Z992 Dependence on renal dialysis: Secondary | ICD-10-CM | POA: Diagnosis not present

## 2023-07-04 DIAGNOSIS — N179 Acute kidney failure, unspecified: Secondary | ICD-10-CM | POA: Diagnosis not present

## 2023-07-04 DIAGNOSIS — N186 End stage renal disease: Secondary | ICD-10-CM | POA: Diagnosis not present

## 2023-07-06 ENCOUNTER — Other Ambulatory Visit
Admission: RE | Admit: 2023-07-06 | Discharge: 2023-07-06 | Disposition: A | Discharge status: Home / Self Care | Source: Ambulatory Visit | Attending: Nephrology | Admitting: Nephrology

## 2023-07-06 DIAGNOSIS — N179 Acute kidney failure, unspecified: Secondary | ICD-10-CM | POA: Diagnosis not present

## 2023-07-06 DIAGNOSIS — Z992 Dependence on renal dialysis: Secondary | ICD-10-CM | POA: Diagnosis not present

## 2023-07-06 DIAGNOSIS — N186 End stage renal disease: Secondary | ICD-10-CM | POA: Insufficient documentation

## 2023-07-06 DIAGNOSIS — N184 Chronic kidney disease, stage 4 (severe): Secondary | ICD-10-CM | POA: Diagnosis not present

## 2023-07-06 LAB — CALCIUM: Calcium: 7.4 mg/dL — ABNORMAL LOW (ref 8.9–10.3)

## 2023-07-06 LAB — HEMOGLOBIN: Hemoglobin: 8 g/dL — ABNORMAL LOW (ref 12.0–15.0)

## 2023-07-08 DIAGNOSIS — Z992 Dependence on renal dialysis: Secondary | ICD-10-CM | POA: Diagnosis not present

## 2023-07-08 DIAGNOSIS — N184 Chronic kidney disease, stage 4 (severe): Secondary | ICD-10-CM | POA: Diagnosis not present

## 2023-07-08 DIAGNOSIS — N179 Acute kidney failure, unspecified: Secondary | ICD-10-CM | POA: Diagnosis not present

## 2023-07-08 DIAGNOSIS — N186 End stage renal disease: Secondary | ICD-10-CM | POA: Diagnosis not present

## 2023-07-11 DIAGNOSIS — Z992 Dependence on renal dialysis: Secondary | ICD-10-CM | POA: Diagnosis not present

## 2023-07-11 DIAGNOSIS — N186 End stage renal disease: Secondary | ICD-10-CM | POA: Diagnosis not present

## 2023-07-11 DIAGNOSIS — N179 Acute kidney failure, unspecified: Secondary | ICD-10-CM | POA: Diagnosis not present

## 2023-07-11 DIAGNOSIS — N184 Chronic kidney disease, stage 4 (severe): Secondary | ICD-10-CM | POA: Diagnosis not present

## 2023-07-12 ENCOUNTER — Ambulatory Visit: Admitting: Internal Medicine

## 2023-07-12 NOTE — Progress Notes (Deleted)
 [facility] Pulmonary Medicine Consultation      Date: 07/12/2023,   MRN# [id number] Sierra Lee 1990/11/13  Sierra Lee is a 33 y.o. old female seen in consultation for *** at the request of ***.     CHIEF COMPLAINT:      HISTORY OF PRESENT ILLNESS      PAST MEDICAL HISTORY   Past Medical History:  Diagnosis Date   Allergy    Anemia    Dialysis patient (HCC)    M, W, F   Dyspnea    ESRD (end stage renal disease) (HCC)    Former cigarette smoker    GERD (gastroesophageal reflux disease)    Hypertension    Morbid obesity (HCC)    OSA on CPAP      SURGICAL HISTORY   Past Surgical History:  Procedure Laterality Date   CESAREAN SECTION     x3   CESAREAN SECTION WITH BILATERAL TUBAL LIGATION Bilateral 07/16/2019   Procedure: CESAREAN SECTION WITH BILATERAL TUBAL LIGATION;  Surgeon: Teresa Fender, MD;  Location: [facility] ORS;  Service: Obstetrics;  Laterality: Bilateral;  Repeat C-Section   DIALYSIS/PERMA CATHETER INSERTION N/A 04/18/2023   Procedure: DIALYSIS/PERMA CATHETER INSERTION;  Surgeon: Celso College, MD;  Location: [facility] INVASIVE CV LAB;  Service: Cardiovascular;  Laterality: N/A;     FAMILY HISTORY   Family History  Problem Relation Age of Onset   Diabetes Mother    Hypertension Mother    Breast cancer Maternal Aunt    Diabetes Paternal Aunt    Breast cancer Maternal Grandmother      SOCIAL HISTORY   Social History   Tobacco Use   Smoking status: Former    Current packs/day: 0.00    Types: Cigarettes    Quit date: 10/12/2018    Years since quitting: 4.7   Smokeless tobacco: Never  Vaping Use   Vaping status: Never Used  Substance Use Topics   Alcohol use: Not Currently   Drug use: Never     MEDICATIONS    Home Medication:  Current Outpatient Rx   Order #: [id number] Class: OTC   Order #: [id number] Class: Normal   Order #: [id number] Class: Normal   Order #: [id number] Class: Print   Order #: [id number] Class: Historical Med    Order #: [id number] Class: Historical Med   Order #: [id number] Class: Historical Med   Order #: [id number] Class: Normal   Order #: [id number] Class: Historical Med   Order #: [id number] Class: Normal   Order #: [id number] Class: Historical Med   Order #: [id number] Class: Historical Med   Order #: [id number] Class: Historical Med   Order #: [id number] Class: Historical Med    Current Medication:  Current Outpatient Medications:    acetaminophen  (TYLENOL ) 325 MG tablet, Take 2 tablets (650 mg total) by mouth every 6 (six) hours as needed for mild pain (pain score 1-3) (or Fever >/= 101)., Disp: , Rfl:    albuterol  (VENTOLIN  HFA) 108 (90 Base) MCG/ACT inhaler, Inhale 2 puffs into the lungs every 6 (six) hours as needed for wheezing or shortness of breath., Disp: 8 g, Rfl: 2   amLODipine  (NORVASC ) 10 MG tablet, Take 1 tablet (10 mg total) by mouth daily. (Patient taking differently: Take 10 mg by mouth every morning.), Disp: 30 tablet, Rfl: 0   Blood Pressure Monitoring KIT, Use to measure blood pressure as needed for home monitoring, Disp: 1 kit, Rfl: 0   calcitRIOL (ROCALTROL) 0.25 MCG capsule, Take 0.25 mcg by mouth daily., Disp: , Rfl:    calcium acetate (PHOSLO) 667  MG capsule, Take 667 mg by mouth 3 (three) times daily with meals., Disp: , Rfl:    cloNIDine  (CATAPRES  - DOSED IN MG/24 HR) 0.2 mg/24hr patch, Place 0.2 mg onto the skin once a week., Disp: , Rfl:    cloNIDine  (CATAPRES ) 0.2 MG tablet, Take 1 tablet (0.2 mg total) by mouth 3 (three) times daily., Disp: 60 tablet, Rfl: 11   furosemide  (LASIX ) 40 MG tablet, Take 20 mg by mouth daily as needed for fluid or edema., Disp: , Rfl:    hydrALAZINE  (APRESOLINE ) 10 MG tablet, Take 1 tablet (10 mg total) by mouth 2 (two) times daily., Disp: 60 tablet, Rfl: 3   losartan  (COZAAR ) 100 MG tablet, Take 1 tablet by mouth daily., Disp: , Rfl:    Multiple Vitamin (MULTIVITAMIN) capsule, Take 1 capsule by mouth daily., Disp: , Rfl:    torsemide (DEMADEX) 100 MG  tablet, Take 100 mg by mouth every morning., Disp: , Rfl:    valsartan (DIOVAN) 160 MG tablet, Take 160 mg by mouth every morning., Disp: , Rfl:     ALLERGIES   Other, Penicillins, and Hydralazine  hcl   There were no vitals taken for this visit.   Review of Systems: Gen:  Denies  fever, sweats, chills weight loss  HEENT: Denies blurred vision, double vision, ear pain, eye pain, hearing loss, nose bleeds, sore throat Cardiac:  No dizziness, chest pain or heaviness, chest tightness,edema, No JVD Resp:   No cough, -sputum production, -shortness of breath,-wheezing, -hemoptysis,  Other:  All other systems negative   Physical Examination:   General Appearance: No distress  EYES PERRLA, EOM intact.   NECK Supple, No JVD Pulmonary: normal breath sounds, No wheezing.  CardiovascularNormal S1,S2.  No m/r/g.   Abdomen: Benign, Soft, non-tender. Neurology UE/LE 5/5 strength, no focal deficits Ext pulses intact, cap refill intact ALL OTHER ROS ARE NEGATIVE      IMAGING    DG Chest Port 1 View Result Date: 06/14/2023 CLINICAL DATA:  Shortness of breath after missing dialysis appointment today. EXAM: PORTABLE CHEST 1 VIEW COMPARISON:  None Available. FINDINGS: Right IJ dialysis catheter with distal split lumens terminates at the superior cavoatrial junction. There is mild-to-moderate cardiomegaly. There is central vascular fullness and slight interstitial edema in the lung bases with small pleural effusions. The lungs are clear of focal infiltrates. The mediastinum is normally outlined. Thoracic cage intact. IMPRESSION: 1. Cardiomegaly with central vascular fullness and slight interstitial edema in the lung bases with small pleural effusions. Findings are consistent with mild CHF. 2. Right IJ dialysis catheter terminates at the superior cavoatrial junction. Electronically Signed   By: Denman Fischer M.D.   On: 06/14/2023 00:57      ASSESSMENT/PLAN                    MEDICATION ADJUSTMENTS/LABS AND TESTS ORDERED:    CURRENT MEDICATIONS REVIEWED AT LENGTH WITH PATIENT TODAY   Patient  satisfied with Plan of action and management. All questions answered  Follow up   I spent a total of *** minutes reviewing chart data, face-to-face evaluation with the patient, counseling and coordination of care as detailed above.     Lady Pier, M.D.  [facility]  Medical Director [facility] Medical Director [facility] Cardio-Pulmonary Department

## 2023-07-13 DIAGNOSIS — N184 Chronic kidney disease, stage 4 (severe): Secondary | ICD-10-CM | POA: Diagnosis not present

## 2023-07-13 DIAGNOSIS — Z992 Dependence on renal dialysis: Secondary | ICD-10-CM | POA: Diagnosis not present

## 2023-07-13 DIAGNOSIS — N179 Acute kidney failure, unspecified: Secondary | ICD-10-CM | POA: Diagnosis not present

## 2023-07-13 DIAGNOSIS — N186 End stage renal disease: Secondary | ICD-10-CM | POA: Diagnosis not present

## 2023-07-14 ENCOUNTER — Other Ambulatory Visit (HOSPITAL_COMMUNITY): Payer: Self-pay

## 2023-07-15 DIAGNOSIS — T82898A Other specified complication of vascular prosthetic devices, implants and grafts, initial encounter: Secondary | ICD-10-CM | POA: Diagnosis not present

## 2023-07-15 DIAGNOSIS — Z992 Dependence on renal dialysis: Secondary | ICD-10-CM | POA: Diagnosis not present

## 2023-07-15 DIAGNOSIS — N186 End stage renal disease: Secondary | ICD-10-CM | POA: Diagnosis not present

## 2023-07-18 DIAGNOSIS — N186 End stage renal disease: Secondary | ICD-10-CM | POA: Diagnosis not present

## 2023-07-18 DIAGNOSIS — Z992 Dependence on renal dialysis: Secondary | ICD-10-CM | POA: Diagnosis not present

## 2023-07-18 DIAGNOSIS — T82898A Other specified complication of vascular prosthetic devices, implants and grafts, initial encounter: Secondary | ICD-10-CM | POA: Diagnosis not present

## 2023-07-20 DIAGNOSIS — T82898A Other specified complication of vascular prosthetic devices, implants and grafts, initial encounter: Secondary | ICD-10-CM | POA: Diagnosis not present

## 2023-07-20 DIAGNOSIS — Z992 Dependence on renal dialysis: Secondary | ICD-10-CM | POA: Diagnosis not present

## 2023-07-20 DIAGNOSIS — N186 End stage renal disease: Secondary | ICD-10-CM | POA: Diagnosis not present

## 2023-07-22 DIAGNOSIS — T82898A Other specified complication of vascular prosthetic devices, implants and grafts, initial encounter: Secondary | ICD-10-CM | POA: Diagnosis not present

## 2023-07-22 DIAGNOSIS — Z992 Dependence on renal dialysis: Secondary | ICD-10-CM | POA: Diagnosis not present

## 2023-07-22 DIAGNOSIS — N186 End stage renal disease: Secondary | ICD-10-CM | POA: Diagnosis not present

## 2023-07-25 DIAGNOSIS — Z992 Dependence on renal dialysis: Secondary | ICD-10-CM | POA: Diagnosis not present

## 2023-07-25 DIAGNOSIS — T82898A Other specified complication of vascular prosthetic devices, implants and grafts, initial encounter: Secondary | ICD-10-CM | POA: Diagnosis not present

## 2023-07-25 DIAGNOSIS — N186 End stage renal disease: Secondary | ICD-10-CM | POA: Diagnosis not present

## 2023-07-26 ENCOUNTER — Telehealth: Admitting: Family Medicine

## 2023-07-26 ENCOUNTER — Encounter: Payer: Self-pay | Admitting: Family Medicine

## 2023-07-26 VITALS — BP 128/99 | Wt 310.0 lb

## 2023-07-26 DIAGNOSIS — D631 Anemia in chronic kidney disease: Secondary | ICD-10-CM | POA: Diagnosis not present

## 2023-07-26 DIAGNOSIS — G4733 Obstructive sleep apnea (adult) (pediatric): Secondary | ICD-10-CM | POA: Diagnosis not present

## 2023-07-26 DIAGNOSIS — N049 Nephrotic syndrome with unspecified morphologic changes: Secondary | ICD-10-CM | POA: Diagnosis not present

## 2023-07-26 DIAGNOSIS — Z6841 Body Mass Index (BMI) 40.0 and over, adult: Secondary | ICD-10-CM | POA: Diagnosis not present

## 2023-07-26 DIAGNOSIS — I10 Essential (primary) hypertension: Secondary | ICD-10-CM

## 2023-07-26 DIAGNOSIS — N186 End stage renal disease: Secondary | ICD-10-CM

## 2023-07-26 DIAGNOSIS — Z992 Dependence on renal dialysis: Secondary | ICD-10-CM

## 2023-07-26 MED ORDER — RENA-VITE PO TABS: 1.0000 | ORAL_TABLET | Freq: Every day | ORAL | 3 refills | Status: AC

## 2023-07-26 MED ORDER — SEMAGLUTIDE-WEIGHT MANAGEMENT 0.25 MG/0.5ML ~~LOC~~ SOAJ
0.2500 mg | SUBCUTANEOUS | 0 refills | Status: AC
Start: 2023-07-26 — End: 2023-08-23

## 2023-07-26 MED ORDER — SEMAGLUTIDE-WEIGHT MANAGEMENT 0.5 MG/0.5ML ~~LOC~~ SOAJ: 0.5000 mg | SUBCUTANEOUS | 0 refills | Status: DC

## 2023-07-26 MED ORDER — CLONIDINE 0.2 MG/24HR TD PTWK: 0.2000 mg | MEDICATED_PATCH | TRANSDERMAL | 6 refills | Status: DC

## 2023-07-26 NOTE — Assessment & Plan Note (Addendum)
 Chronic Obesity with a current BMI of 51, down from 55. She has lost weight, currently at 310 pounds, with a goal to reduce BMI below 35 for renal transplant eligibility. Wegovy  (semaglutide ) is considered for weight management, with no contraindications such as pancreatitis or medullary thyroid  cancer. Aiming for at least 5% reduction in body weight to maintain insurance coverage. - Initiate Wegovy  0.25 mg once weekly for 4 weeks, then increase to 0.5 mg once weekly for another month. - Monitor weight loss, aiming for at least 5% reduction in body weight. - Schedule follow-up in 6 weeks to assess tolerance and progress. - will send note to prior authorization team and complete paperwork requesting approval for weight loss medication for medical treatment of obesity due to renal complications/ESRD  -  complete form faxed to [phone] - [place] Call center [phone]

## 2023-07-26 NOTE — Assessment & Plan Note (Signed)
 End-stage renal disease managed with dialysis. She is exploring options for dialysis, including in-home nocturnal dialysis versus in-center dialysis, and is scheduled to discuss these options with general surgery. - Discuss dialysis options with general surgery, including in-home nocturnal dialysis versus in-center dialysis. - f/u with [facility] nephrology as scheduled

## 2023-07-26 NOTE — Assessment & Plan Note (Signed)
 Nephrotic syndrome managed with nephrology follow-up at [facility]. She is on calcitriol and Renavite for management. - Continue current nephrology management  - continue torsemide 100mg  daily

## 2023-07-26 NOTE — Assessment & Plan Note (Signed)
 Severe obstructive sleep apnea Severe obstructive sleep apnea managed with CPAP therapy. She reports good adherence and improved sleep quality with CPAP use. Chronic  Stable with CPAP use  Patient advised to continue CPAP nightly  The patient has had significant benefit from the use of CPAP machine with considerable improvements in quality of life, work production and decrease medical complaints.  The patient will need continued maintenance and care CPAP supplies (including upgrades as deemed appropriate) in order to continue treatment for sleep apnea as this is medically necessary.

## 2023-07-26 NOTE — Assessment & Plan Note (Signed)
 Chronic hypertension with recent blood pressure readings of 134/100 mmHg and 128/99 mmHg. She is on multiple antihypertensive medications including clonidine  0.2mg  TID and 0.2mg  patchds, hydralazine  10mg  BID, losartan  100mg  daily, torsemide 100mg  daily , and amlodipine  10mg  daily. - Continue current antihypertensive regimen including clonidine , hydralazine , losartan , torsemide, and amlodipine . - Refill clonidine  patches and hydralazine  as needed.

## 2023-07-26 NOTE — Progress Notes (Signed)
 MyChart Video Visit    Virtual Visit via Video Note   This format is felt to be most appropriate for this patient at this time. Physical exam was limited by quality of the video and audio technology used for the visit.   Patient location: Patient's home address   Provider location: [facility]  [address], [address]  [place], [place] [address]   I discussed the limitations of evaluation and management by telemedicine and the availability of in person appointments. The patient expressed understanding and agreed to proceed.  Patient: Sierra Lee   DOB: 1990/04/25   33 y.o. Female  MRN: [id number] Visit Date: 07/26/2023  Today's healthcare provider: Mimi Alt, MD   No chief complaint on file.  Subjective    HPI   Discussed the use of AI scribe software for clinical note transcription with the patient, who gave verbal consent to proceed.  History of Present Illness Sierra Lee is a 33 year old female with hypertension, severe obstructive sleep apnea, anemia, and ESRD on dialysis who presents for a virtual follow-up to discuss weight loss options.  She is interested in starting Wegovy  for weight management. Her last recorded weight was 331 pounds on April 1st, with a BMI of 55.08. She has since lost 37 pounds, bringing her weight to 310 pounds and her BMI to 51, attributing the weight loss to fluid reduction.  Her medical history includes hypertension, managed with clonidine  0.2 mg three times daily, a clonidine  patch 0.2 mg weekly, hydralazine  10 mg twice daily, losartan , and amlodipine . Her blood pressure today was 134/100, though it has been higher during dialysis sessions.  She has severe obstructive sleep apnea and uses CPAP materials nightly, which helps her sleep well.  She has anemia of chronic disease and end-stage renal disease (ESRD) on dialysis. She follows with nephrology at [facility] for her nephrotic syndrome and has been referred  to the [facility] transplant clinic. She is interested in a kidney transplant and has multiple family members willing to donate, but she needs to reduce her BMI to less than 35 to be eligible.  Her current medications for chronic kidney disease include calcitriol and torsemide 100 mg instead of Lasix . She also takes Renalvite once daily.  No history of pancreatitis or medullary thyroid  cancer in her or her family.      Past Medical History:  Diagnosis Date   Allergy    Anemia    Dialysis patient (HCC)    M, W, F   Dyspnea    ESRD (end stage renal disease) (HCC)    Former cigarette smoker    GERD (gastroesophageal reflux disease)    Hypertension    Morbid obesity (HCC)    OSA on CPAP     Medications: Outpatient Medications Prior to Visit  Medication Sig   [DISCONTINUED] B Complex-C-Folic Acid  (RENA-VITE PO) Take 1 tablet by mouth daily.   acetaminophen  (TYLENOL ) 325 MG tablet Take 2 tablets (650 mg total) by mouth every 6 (six) hours as needed for mild pain (pain score 1-3) (or Fever >/= 101).   albuterol  (VENTOLIN  HFA) 108 (90 Base) MCG/ACT inhaler Inhale 2 puffs into the lungs every 6 (six) hours as needed for wheezing or shortness of breath.   amLODipine  (NORVASC ) 10 MG tablet Take 1 tablet (10 mg total) by mouth daily. (Patient taking differently: Take 10 mg by mouth every morning.)   calcitRIOL (ROCALTROL) 0.25 MCG capsule Take 0.25 mcg by mouth daily.   calcium acetate (  PHOSLO) 667 MG capsule Take 667 mg by mouth 3 (three) times daily with meals.   cloNIDine  (CATAPRES ) 0.2 MG tablet Take 1 tablet (0.2 mg total) by mouth 3 (three) times daily.   hydrALAZINE  (APRESOLINE ) 10 MG tablet Take 1 tablet (10 mg total) by mouth 2 (two) times daily.   losartan  (COZAAR ) 100 MG tablet Take 1 tablet by mouth daily.   torsemide (DEMADEX) 100 MG tablet Take 100 mg by mouth every morning.   [DISCONTINUED] Blood Pressure Monitoring KIT Use to measure blood pressure as needed for home monitoring    [DISCONTINUED] cloNIDine  (CATAPRES  - DOSED IN MG/24 HR) 0.2 mg/24hr patch Place 0.2 mg onto the skin once a week.   [DISCONTINUED] furosemide  (LASIX ) 40 MG tablet Take 20 mg by mouth daily as needed for fluid or edema.   [DISCONTINUED] Multiple Vitamin (MULTIVITAMIN) capsule Take 1 capsule by mouth daily.   [DISCONTINUED] valsartan (DIOVAN) 160 MG tablet Take 160 mg by mouth every morning.   No facility-administered medications prior to visit.    Review of Systems  Last CBC Lab Results  Component Value Date   WBC 9.2 06/14/2023   HGB 8.0 (L) 07/06/2023   HCT 25.2 (L) 06/14/2023   MCV 86.0 06/14/2023   MCH 28.0 06/14/2023   RDW 12.6 06/14/2023   PLT 285 06/14/2023   Last metabolic panel Lab Results  Component Value Date   GLUCOSE 103 (H) 06/14/2023   NA 138 06/14/2023   K 4.0 06/14/2023   CL 104 06/14/2023   CO2 22 06/14/2023   BUN 35 (H) 06/14/2023   CREATININE 11.41 (H) 06/14/2023   GFRNONAA 4 (L) 06/14/2023   CALCIUM 7.4 (L) 07/06/2023   PHOS 6.6 (H) 03/17/2023   PROT 7.1 06/14/2023   ALBUMIN 2.9 (L) 06/14/2023   LABGLOB 2.7 02/02/2023   AGRATIO 1.0 (L) 08/18/2022   BILITOT 0.7 06/14/2023   ALKPHOS 42 06/14/2023   AST 14 (L) 06/14/2023   ALT 12 06/14/2023   ANIONGAP 12 06/14/2023   Last lipids Lab Results  Component Value Date   CHOL 125 08/18/2022   HDL 36 (L) 08/18/2022   LDLCALC 66 08/18/2022   TRIG 131 08/18/2022   CHOLHDL 3.5 08/18/2022   Last hemoglobin A1c Lab Results  Component Value Date   HGBA1C 5.5 01/26/2023   Last thyroid  functions Lab Results  Component Value Date   TSH 2.290 01/26/2023   Last vitamin D No results found for: "25OHVITD2", "25OHVITD3", "VD25OH" Last vitamin B12 and Folate Lab Results  Component Value Date   VITAMINB12 312 06/14/2023   FOLATE 18.4 06/14/2023      Objective    BP (!) 128/99   Wt (!) 310 lb (140.6 kg)   BMI 51.59 kg/m   BP Readings from Last 3 Encounters:  07/26/23 (!) 128/99  06/14/23 (!)  149/92  05/30/23 (!) 144/93   Wt Readings from Last 3 Encounters:  07/26/23 (!) 310 lb (140.6 kg)  06/14/23 (!) 331 lb (150.1 kg)  05/30/23 217 lb 8 oz (98.7 kg)        Physical Exam Constitutional:      General: She is not in acute distress. Pulmonary:     Effort: Pulmonary effort is normal. No respiratory distress.  Neurological:     Mental Status: She is alert.        Assessment & Plan     Problem List Items Addressed This Visit       Cardiovascular and Mediastinum   Primary hypertension  Chronic hypertension with recent blood pressure readings of 134/100 mmHg and 128/99 mmHg. She is on multiple antihypertensive medications including clonidine  0.2mg  TID and 0.2mg  patchds, hydralazine  10mg  BID, losartan  100mg  daily, torsemide 100mg  daily , and amlodipine  10mg  daily. - Continue current antihypertensive regimen including clonidine , hydralazine , losartan , torsemide, and amlodipine . - Refill clonidine  patches and hydralazine  as needed.      Relevant Medications   Semaglutide -Weight Management 0.25 MG/0.5ML SOAJ   Semaglutide -Weight Management 0.5 MG/0.5ML SOAJ (Start on 08/24/2023)   cloNIDine  (CATAPRES  - DOSED IN MG/24 HR) 0.2 mg/24hr patch     Respiratory   Severe obstructive sleep apnea   Severe obstructive sleep apnea Severe obstructive sleep apnea managed with CPAP therapy. She reports good adherence and improved sleep quality with CPAP use. Chronic  Stable with CPAP use  Patient advised to continue CPAP nightly  The patient has had significant benefit from the use of CPAP machine with considerable improvements in quality of life, work production and decrease medical complaints.  The patient will need continued maintenance and care CPAP supplies (including upgrades as deemed appropriate) in order to continue treatment for sleep apnea as this is medically necessary.         Relevant Medications   Semaglutide -Weight Management 0.25 MG/0.5ML SOAJ    Semaglutide -Weight Management 0.5 MG/0.5ML SOAJ (Start on 08/24/2023)     Genitourinary   Nephrotic syndrome   Nephrotic syndrome managed with nephrology follow-up at [facility]. She is on calcitriol and Renavite for management. - Continue current nephrology management  - continue torsemide 100mg  daily       ESRD (end stage renal disease) on dialysis [facility])   End-stage renal disease managed with dialysis. She is exploring options for dialysis, including in-home nocturnal dialysis versus in-center dialysis, and is scheduled to discuss these options with general surgery. - Discuss dialysis options with general surgery, including in-home nocturnal dialysis versus in-center dialysis. - f/u with [facility] nephrology as scheduled       Relevant Medications   multivitamin (RENA-VIT) TABS tablet   Anemia of renal disease     Other   Morbid obesity with BMI of 50.0-59.9, adult (HCC) - Primary   Chronic Obesity with a current BMI of 51, down from 55. She has lost weight, currently at 310 pounds, with a goal to reduce BMI below 35 for renal transplant eligibility. Wegovy  (semaglutide ) is considered for weight management, with no contraindications such as pancreatitis or medullary thyroid  cancer. Aiming for at least 5% reduction in body weight to maintain insurance coverage. - Initiate Wegovy  0.25 mg once weekly for 4 weeks, then increase to 0.5 mg once weekly for another month. - Monitor weight loss, aiming for at least 5% reduction in body weight. - Schedule follow-up in 6 weeks to assess tolerance and progress. - will send note to prior authorization team and complete paperwork requesting approval for weight loss medication for medical treatment of obesity due to renal complications/ESRD  - [place] complete form faxed to [phone] - [facility]acy [place] Call center [phone]      Relevant Medications   Semaglutide -Weight Management 0.25 MG/0.5ML SOAJ   Semaglutide -Weight Management 0.5 MG/0.5ML SOAJ (Start on  08/24/2023)     Assessment & Plan           Return in about 6 weeks (around 09/06/2023) for Weight MGMT.     I discussed the assessment and treatment plan with the patient. The patient was provided an opportunity to ask questions and all were answered. The patient agreed with the  plan and demonstrated an understanding of the instructions.   The patient was advised to call back or seek an in-person evaluation if the symptoms worsen or if the condition fails to improve as anticipated.  I provided 35 minutes of non-face-to-face time during this encounter.   Mimi Alt, MD [facility] [phone] (phone) [phone] (fax)  [facility]

## 2023-07-27 ENCOUNTER — Telehealth: Payer: Self-pay

## 2023-07-27 ENCOUNTER — Other Ambulatory Visit (HOSPITAL_COMMUNITY): Payer: Self-pay

## 2023-07-27 DIAGNOSIS — T82898A Other specified complication of vascular prosthetic devices, implants and grafts, initial encounter: Secondary | ICD-10-CM | POA: Diagnosis not present

## 2023-07-27 DIAGNOSIS — N186 End stage renal disease: Secondary | ICD-10-CM | POA: Diagnosis not present

## 2023-07-27 DIAGNOSIS — Z992 Dependence on renal dialysis: Secondary | ICD-10-CM | POA: Diagnosis not present

## 2023-07-27 NOTE — Telephone Encounter (Signed)
 Pharmacy Patient Advocate Encounter  Received notification from [employer] that Prior Authorization for Wegovy  0.25MG /0.5ML auto-injectors has been APPROVED from 07/27/23 to 01/23/24. Ran test claim, Copay is $4. This test claim was processed through [facility]- copay amounts may vary at other pharmacies due to pharmacy/plan contracts, or as the patient moves through the different stages of their insurance plan.   PA #/Case ID/Reference #: [id number]

## 2023-07-27 NOTE — Telephone Encounter (Signed)
 Pharmacy Patient Advocate Encounter   Received notification from Onbase that prior authorization for Wegovy  0.25MG /0.5ML auto-injectors is required/requested.   Insurance verification completed.   The patient is insured through [employer] .   Per test claim: PA required; PA submitted to above mentioned insurance via CoverMyMeds Key/confirmation #/EOC [id number] Status is pending

## 2023-07-29 ENCOUNTER — Encounter: Payer: Self-pay | Admitting: Family Medicine

## 2023-07-29 DIAGNOSIS — Z992 Dependence on renal dialysis: Secondary | ICD-10-CM | POA: Diagnosis not present

## 2023-07-29 DIAGNOSIS — N186 End stage renal disease: Secondary | ICD-10-CM | POA: Diagnosis not present

## 2023-07-29 DIAGNOSIS — T82898A Other specified complication of vascular prosthetic devices, implants and grafts, initial encounter: Secondary | ICD-10-CM | POA: Diagnosis not present

## 2023-08-01 DIAGNOSIS — Z992 Dependence on renal dialysis: Secondary | ICD-10-CM | POA: Diagnosis not present

## 2023-08-01 DIAGNOSIS — N186 End stage renal disease: Secondary | ICD-10-CM | POA: Diagnosis not present

## 2023-08-01 DIAGNOSIS — T82898A Other specified complication of vascular prosthetic devices, implants and grafts, initial encounter: Secondary | ICD-10-CM | POA: Diagnosis not present

## 2023-08-02 ENCOUNTER — Encounter (INDEPENDENT_AMBULATORY_CARE_PROVIDER_SITE_OTHER): Payer: Self-pay

## 2023-08-03 DIAGNOSIS — T82898A Other specified complication of vascular prosthetic devices, implants and grafts, initial encounter: Secondary | ICD-10-CM | POA: Diagnosis not present

## 2023-08-03 DIAGNOSIS — N186 End stage renal disease: Secondary | ICD-10-CM | POA: Diagnosis not present

## 2023-08-03 DIAGNOSIS — Z992 Dependence on renal dialysis: Secondary | ICD-10-CM | POA: Diagnosis not present

## 2023-08-03 DIAGNOSIS — G4733 Obstructive sleep apnea (adult) (pediatric): Secondary | ICD-10-CM | POA: Diagnosis not present

## 2023-08-05 DIAGNOSIS — T82898A Other specified complication of vascular prosthetic devices, implants and grafts, initial encounter: Secondary | ICD-10-CM | POA: Diagnosis not present

## 2023-08-05 DIAGNOSIS — Z992 Dependence on renal dialysis: Secondary | ICD-10-CM | POA: Diagnosis not present

## 2023-08-05 DIAGNOSIS — N186 End stage renal disease: Secondary | ICD-10-CM | POA: Diagnosis not present

## 2023-08-08 DIAGNOSIS — Z992 Dependence on renal dialysis: Secondary | ICD-10-CM | POA: Diagnosis not present

## 2023-08-08 DIAGNOSIS — N186 End stage renal disease: Secondary | ICD-10-CM | POA: Diagnosis not present

## 2023-08-10 DIAGNOSIS — Z992 Dependence on renal dialysis: Secondary | ICD-10-CM | POA: Diagnosis not present

## 2023-08-10 DIAGNOSIS — N186 End stage renal disease: Secondary | ICD-10-CM | POA: Diagnosis not present

## 2023-08-12 DIAGNOSIS — Z992 Dependence on renal dialysis: Secondary | ICD-10-CM | POA: Diagnosis not present

## 2023-08-12 DIAGNOSIS — N186 End stage renal disease: Secondary | ICD-10-CM | POA: Diagnosis not present

## 2023-08-13 DIAGNOSIS — Z992 Dependence on renal dialysis: Secondary | ICD-10-CM | POA: Diagnosis not present

## 2023-08-13 DIAGNOSIS — N186 End stage renal disease: Secondary | ICD-10-CM | POA: Diagnosis not present

## 2023-08-15 DIAGNOSIS — Z992 Dependence on renal dialysis: Secondary | ICD-10-CM | POA: Diagnosis not present

## 2023-08-15 DIAGNOSIS — N186 End stage renal disease: Secondary | ICD-10-CM | POA: Diagnosis not present

## 2023-08-15 DIAGNOSIS — Z23 Encounter for immunization: Secondary | ICD-10-CM | POA: Diagnosis not present

## 2023-08-17 DIAGNOSIS — Z23 Encounter for immunization: Secondary | ICD-10-CM | POA: Diagnosis not present

## 2023-08-17 DIAGNOSIS — N186 End stage renal disease: Secondary | ICD-10-CM | POA: Diagnosis not present

## 2023-08-17 DIAGNOSIS — Z992 Dependence on renal dialysis: Secondary | ICD-10-CM | POA: Diagnosis not present

## 2023-08-18 ENCOUNTER — Encounter: Payer: Self-pay | Admitting: Family Medicine

## 2023-08-18 ENCOUNTER — Telehealth: Payer: Self-pay | Admitting: Family Medicine

## 2023-08-18 NOTE — Telephone Encounter (Signed)
 Scheduled patient for 08/29/2023 at 1 pm. Appointment for July 1,2025 canceled.

## 2023-08-18 NOTE — Telephone Encounter (Signed)
 Reviewed. Agree with scheduled appointment

## 2023-08-18 NOTE — Telephone Encounter (Signed)
 Copied from CRM [id number]. Topic: Appointments - Appointment Scheduling >> Aug 18, 2023  2:18 PM Sierra Lee wrote:  Patient/patient representative is calling to schedule an appointment. Refer to attachments for appointment information. Patient called to schedule a medication f/u for he Wegovy  and needs to see provider before her first available on 7/1. Please contact patient with an appt

## 2023-08-19 ENCOUNTER — Telehealth: Payer: Self-pay | Admitting: Family Medicine

## 2023-08-19 DIAGNOSIS — Z992 Dependence on renal dialysis: Secondary | ICD-10-CM | POA: Diagnosis not present

## 2023-08-19 DIAGNOSIS — Z23 Encounter for immunization: Secondary | ICD-10-CM | POA: Diagnosis not present

## 2023-08-19 DIAGNOSIS — N186 End stage renal disease: Secondary | ICD-10-CM | POA: Diagnosis not present

## 2023-08-19 NOTE — Telephone Encounter (Signed)
 Called patient to confirm appt she needs is already scheduled for 6/16 nothing else needed at this time

## 2023-08-21 ENCOUNTER — Other Ambulatory Visit: Payer: Self-pay | Admitting: Family Medicine

## 2023-08-21 DIAGNOSIS — G4733 Obstructive sleep apnea (adult) (pediatric): Secondary | ICD-10-CM

## 2023-08-21 DIAGNOSIS — I10 Essential (primary) hypertension: Secondary | ICD-10-CM

## 2023-08-22 ENCOUNTER — Encounter: Payer: Self-pay | Admitting: Nephrology

## 2023-08-22 DIAGNOSIS — N186 End stage renal disease: Secondary | ICD-10-CM | POA: Diagnosis not present

## 2023-08-22 DIAGNOSIS — Z23 Encounter for immunization: Secondary | ICD-10-CM | POA: Diagnosis not present

## 2023-08-22 DIAGNOSIS — Z992 Dependence on renal dialysis: Secondary | ICD-10-CM | POA: Diagnosis not present

## 2023-08-24 DIAGNOSIS — N186 End stage renal disease: Secondary | ICD-10-CM | POA: Diagnosis not present

## 2023-08-24 DIAGNOSIS — Z992 Dependence on renal dialysis: Secondary | ICD-10-CM | POA: Diagnosis not present

## 2023-08-24 DIAGNOSIS — Z23 Encounter for immunization: Secondary | ICD-10-CM | POA: Diagnosis not present

## 2023-08-26 DIAGNOSIS — Z992 Dependence on renal dialysis: Secondary | ICD-10-CM | POA: Diagnosis not present

## 2023-08-26 DIAGNOSIS — N186 End stage renal disease: Secondary | ICD-10-CM | POA: Diagnosis not present

## 2023-08-26 DIAGNOSIS — Z23 Encounter for immunization: Secondary | ICD-10-CM | POA: Diagnosis not present

## 2023-08-29 ENCOUNTER — Ambulatory Visit (INDEPENDENT_AMBULATORY_CARE_PROVIDER_SITE_OTHER): Admitting: Family Medicine

## 2023-08-29 DIAGNOSIS — G4733 Obstructive sleep apnea (adult) (pediatric): Secondary | ICD-10-CM

## 2023-08-29 DIAGNOSIS — Z6841 Body Mass Index (BMI) 40.0 and over, adult: Secondary | ICD-10-CM | POA: Diagnosis not present

## 2023-08-29 DIAGNOSIS — I10 Essential (primary) hypertension: Secondary | ICD-10-CM | POA: Diagnosis not present

## 2023-08-29 DIAGNOSIS — Z23 Encounter for immunization: Secondary | ICD-10-CM | POA: Diagnosis not present

## 2023-08-29 DIAGNOSIS — N186 End stage renal disease: Secondary | ICD-10-CM | POA: Diagnosis not present

## 2023-08-29 DIAGNOSIS — Z992 Dependence on renal dialysis: Secondary | ICD-10-CM | POA: Diagnosis not present

## 2023-08-29 MED ORDER — SEMAGLUTIDE-WEIGHT MANAGEMENT 0.5 MG/0.5ML ~~LOC~~ SOAJ: 0.5000 mg | SUBCUTANEOUS | 0 refills | Status: AC

## 2023-08-29 MED ORDER — WEGOVY 1.7 MG/0.75ML ~~LOC~~ SOAJ: 1.7000 mg | SUBCUTANEOUS | 6 refills | Status: DC

## 2023-08-29 MED ORDER — HYDRALAZINE HCL 25 MG PO TABS: 25.0000 mg | ORAL_TABLET | Freq: Two times a day (BID) | ORAL | 3 refills | Status: AC

## 2023-08-29 MED ORDER — SEMAGLUTIDE-WEIGHT MANAGEMENT 1 MG/0.5ML ~~LOC~~ SOAJ: 1.0000 mg | SUBCUTANEOUS | 0 refills | Status: DC

## 2023-08-29 NOTE — Progress Notes (Signed)
 Established patient visit   Patient: Sierra Lee   DOB: 1990/06/27   32 y.o. Female  MRN: [id number] Visit Date: 08/29/2023  Today's healthcare provider: Mimi Alt, MD   Chief Complaint  Patient presents with   Weight Loss   Subjective       Discussed the use of AI scribe software for clinical note transcription with the patient, who gave verbal consent to proceed.  History of Present Illness Sierra Lee is a 33 year old female with end-stage renal disease who presents for follow-up on obesity treatment.  She has been on Wegovy  0.25 mg and reports feeling full faster, which she considers a positive effect. She has lost six pounds since her last visit in May, with a starting weight of 331 pounds in April. She is motivated to continue her weight loss journey and has been engaging in physical activity by walking around the block with her children. She plans to join a gym with her oldest daughter.  She has end-stage renal disease and is actively working with renal transplant services. She is scheduled for a peritoneal dialysis catheter placement and has commenced training for peritoneal dialysis. Her mother, a Engineer, civil (consulting), will assist her with the process. She continues regular follow-ups with nephrology and transplant medicine.  Her blood pressure readings at home remain consistent, but they are significantly elevated during dialysis sessions. She is on multiple antihypertensive medications, including amlodipine  10 mg, clonidine  0.2 mg, hydralazine  10 mg, losartan  100 mg, and torsemide 100 mg daily. She is concerned that her current regimen is not effectively controlling her blood pressure.  In terms of diet, she has made significant changes, eliminating fried foods and pork, and focusing on baked chicken and [place] bacon. She consumes a variety of vegetables, including broccoli, spinach, cabbage, and carrots, but has stopped eating fish and lettuce. She reports  improved vision after previously experiencing issues due to high blood pressure and fluid retention. No constipation or diarrhea with her current medication. PA approved for Wegovy  0.25mg  weekly on 07/27/23     Past Medical History:  Diagnosis Date   Allergy    Anemia    Dialysis patient [facility])    M, W, F   Dyspnea    ESRD (end stage renal disease) (HCC)    Former cigarette smoker    GERD (gastroesophageal reflux disease)    Hypertension    Morbid obesity (HCC)    OSA on CPAP     Medications: Outpatient Medications Prior to Visit  Medication Sig   acetaminophen  (TYLENOL ) 325 MG tablet Take 2 tablets (650 mg total) by mouth every 6 (six) hours as needed for mild pain (pain score 1-3) (or Fever >/= 101).   albuterol  (VENTOLIN  HFA) 108 (90 Base) MCG/ACT inhaler Inhale 2 puffs into the lungs every 6 (six) hours as needed for wheezing or shortness of breath.   amLODipine  (NORVASC ) 10 MG tablet Take 1 tablet (10 mg total) by mouth daily. (Patient taking differently: Take 10 mg by mouth every morning.)   calcitRIOL (ROCALTROL) 0.25 MCG capsule Take 0.25 mcg by mouth daily.   calcium acetate (PHOSLO) 667 MG capsule Take 667 mg by mouth 3 (three) times daily with meals.   cloNIDine  (CATAPRES  - DOSED IN MG/24 HR) 0.2 mg/24hr patch Place 1 patch (0.2 mg total) onto the skin once a week.   cloNIDine  (CATAPRES ) 0.2 MG tablet Take 1 tablet (0.2 mg total) by mouth 3 (three) times daily.   losartan  (COZAAR ) 100 MG  tablet Take 1 tablet by mouth daily.   multivitamin (RENA-VIT) TABS tablet Take 1 tablet by mouth daily.   torsemide (DEMADEX) 100 MG tablet Take 100 mg by mouth every morning.   [DISCONTINUED] hydrALAZINE  (APRESOLINE ) 10 MG tablet Take 1 tablet (10 mg total) by mouth 2 (two) times daily.   [DISCONTINUED] Semaglutide -Weight Management 0.5 MG/0.5ML SOAJ Inject 0.5 mg into the skin once a week for 28 days.   No facility-administered medications prior to visit.    Review of  Systems  Last CBC Lab Results  Component Value Date   WBC 9.2 06/14/2023   HGB 8.0 (L) 07/06/2023   HCT 25.2 (L) 06/14/2023   MCV 86.0 06/14/2023   MCH 28.0 06/14/2023   RDW 12.6 06/14/2023   PLT 285 06/14/2023   Last metabolic panel Lab Results  Component Value Date   GLUCOSE 103 (H) 06/14/2023   NA 138 06/14/2023   K 4.0 06/14/2023   CL 104 06/14/2023   CO2 22 06/14/2023   BUN 35 (H) 06/14/2023   CREATININE 11.41 (H) 06/14/2023   GFRNONAA 4 (L) 06/14/2023   CALCIUM 7.4 (L) 07/06/2023   PHOS 6.6 (H) 03/17/2023   PROT 7.1 06/14/2023   ALBUMIN 2.9 (L) 06/14/2023   LABGLOB 2.7 02/02/2023   AGRATIO 1.0 (L) 08/18/2022   BILITOT 0.7 06/14/2023   ALKPHOS 42 06/14/2023   AST 14 (L) 06/14/2023   ALT 12 06/14/2023   ANIONGAP 12 06/14/2023   Last lipids Lab Results  Component Value Date   CHOL 125 08/18/2022   HDL 36 (L) 08/18/2022   LDLCALC 66 08/18/2022   TRIG 131 08/18/2022   CHOLHDL 3.5 08/18/2022   Last hemoglobin A1c Lab Results  Component Value Date   HGBA1C 5.5 01/26/2023   Last thyroid  functions Lab Results  Component Value Date   TSH 2.290 01/26/2023   Last vitamin D No results found for: 25OHVITD2, 25OHVITD3, VD25OH Last vitamin B12 and Folate Lab Results  Component Value Date   VITAMINB12 312 06/14/2023   FOLATE 18.4 06/14/2023        Objective    BP (!) 135/103 Comment: home BP  Pulse 97   Ht 5' 5 (1.651 m)   Wt (!) 304 lb (137.9 kg)   SpO2 100%   BMI 50.59 kg/m  BP Readings from Last 3 Encounters:  08/29/23 (!) 135/103  07/26/23 (!) 128/99  06/14/23 (!) 149/92   Wt Readings from Last 3 Encounters:  08/29/23 (!) 304 lb (137.9 kg)  07/26/23 (!) 310 lb (140.6 kg)  06/14/23 (!) 331 lb (150.1 kg)        Physical Exam  General: Alert, no acute distress Cardio: Normal S1 and S2, RRR, no r/m/g Pulm: CTAB, normal work of breathing Extremities: trace LE edema    No results found for any visits on 08/29/23.  Assessment &  Plan     Problem List Items Addressed This Visit       Cardiovascular and Mediastinum   Primary hypertension   Relevant Medications   Semaglutide -Weight Management 0.5 MG/0.5ML SOAJ   hydrALAZINE  (APRESOLINE ) 25 MG tablet     Respiratory   Severe obstructive sleep apnea   Relevant Medications   Semaglutide -Weight Management 0.5 MG/0.5ML SOAJ     Other   Morbid obesity with BMI of 50.0-59.9, adult (HCC)   Relevant Medications   Semaglutide -Weight Management 0.5 MG/0.5ML SOAJ   Semaglutide -Weight Management 1 MG/0.5ML SOAJ (Start on 09/27/2023)   Semaglutide -Weight Management (WEGOVY ) 1.7 MG/0.75ML SOAJ (Start on  10/26/2023)    Assessment & Plan End-Stage Renal Disease (ESRD) She has ESRD and is collaborating with transplant medicine. She is scheduled for peritoneal dialysis catheter placement and has commenced training for home dialysis. This is expected to enhance her quality of life by enabling overnight dialysis at home. - Continue follow-up with nephrology and transplant medicine. - Proceed with peritoneal dialysis catheter placement as scheduled. - Continue training for home dialysis.  Obesity She is undergoing obesity management with Wegovy  0.25 mg, resulting in a 6-pound weight loss since May. Her initial weight in April was 331 pounds. She reports early satiety with the medication and no significant side effects like constipation or diarrhea. The objective is to facilitate weight loss to improve her renal transplant candidacy. The plan is to titrate Wegovy  dosage to optimize weight loss outcomes. - Increase Wegovy  to 0.5 mg weekly for one month, then 1 mg weekly for one month, and 1.7 mg weekly for one month with refills. - Encourage dietary modifications, including avoiding fried foods, consuming baked chicken, [place] bacon, and ample vegetables. - Promote physical activity, including walking and considering a gym membership.  Hypertension Her blood pressure is elevated  at 146/103 mmHg, with similar readings at home and higher during dialysis. She is on multiple antihypertensives: amlodipine , clonidine , hydralazine , losartan , and torsemide. The regimen appears resistant, necessitating adjustments. Increasing hydralazine  is planned to improve blood pressure control, considering her renal function. - Increase hydralazine  to 25 mg twice daily, considering renal function limitations. - Continue current antihypertensive regimen and closely monitor blood pressure.  Follow-up She requires follow-up to evaluate progress in weight loss and blood pressure management. - Schedule follow-up appointment in 1 to 1.5 months to reassess blood pressure and weight management progress.     Return in about 1 month (around 09/28/2023) for HTN.         Mimi Alt, MD  [facility] [phone] (phone) [phone] (fax)  [facility]

## 2023-08-31 DIAGNOSIS — N186 End stage renal disease: Secondary | ICD-10-CM | POA: Diagnosis not present

## 2023-08-31 DIAGNOSIS — Z23 Encounter for immunization: Secondary | ICD-10-CM | POA: Diagnosis not present

## 2023-08-31 DIAGNOSIS — Z992 Dependence on renal dialysis: Secondary | ICD-10-CM | POA: Diagnosis not present

## 2023-09-02 DIAGNOSIS — Z992 Dependence on renal dialysis: Secondary | ICD-10-CM | POA: Diagnosis not present

## 2023-09-02 DIAGNOSIS — Z23 Encounter for immunization: Secondary | ICD-10-CM | POA: Diagnosis not present

## 2023-09-02 DIAGNOSIS — N186 End stage renal disease: Secondary | ICD-10-CM | POA: Diagnosis not present

## 2023-09-03 DIAGNOSIS — G4733 Obstructive sleep apnea (adult) (pediatric): Secondary | ICD-10-CM | POA: Diagnosis not present

## 2023-09-05 ENCOUNTER — Ambulatory Visit: Admitting: Pulmonary Disease

## 2023-09-05 DIAGNOSIS — N2889 Other specified disorders of kidney and ureter: Secondary | ICD-10-CM | POA: Diagnosis not present

## 2023-09-05 DIAGNOSIS — N271 Small kidney, bilateral: Secondary | ICD-10-CM | POA: Diagnosis not present

## 2023-09-05 DIAGNOSIS — R93429 Abnormal radiologic findings on diagnostic imaging of unspecified kidney: Secondary | ICD-10-CM | POA: Diagnosis not present

## 2023-09-05 DIAGNOSIS — Z23 Encounter for immunization: Secondary | ICD-10-CM | POA: Diagnosis not present

## 2023-09-05 DIAGNOSIS — Z01818 Encounter for other preprocedural examination: Secondary | ICD-10-CM | POA: Diagnosis not present

## 2023-09-05 DIAGNOSIS — Z992 Dependence on renal dialysis: Secondary | ICD-10-CM | POA: Diagnosis not present

## 2023-09-05 DIAGNOSIS — K429 Umbilical hernia without obstruction or gangrene: Secondary | ICD-10-CM | POA: Diagnosis not present

## 2023-09-05 DIAGNOSIS — N186 End stage renal disease: Secondary | ICD-10-CM | POA: Diagnosis not present

## 2023-09-07 DIAGNOSIS — N186 End stage renal disease: Secondary | ICD-10-CM | POA: Diagnosis not present

## 2023-09-07 DIAGNOSIS — Z992 Dependence on renal dialysis: Secondary | ICD-10-CM | POA: Diagnosis not present

## 2023-09-07 DIAGNOSIS — Z23 Encounter for immunization: Secondary | ICD-10-CM | POA: Diagnosis not present

## 2023-09-09 DIAGNOSIS — N186 End stage renal disease: Secondary | ICD-10-CM | POA: Diagnosis not present

## 2023-09-09 DIAGNOSIS — Z992 Dependence on renal dialysis: Secondary | ICD-10-CM | POA: Diagnosis not present

## 2023-09-09 DIAGNOSIS — Z23 Encounter for immunization: Secondary | ICD-10-CM | POA: Diagnosis not present

## 2023-09-12 DIAGNOSIS — Z23 Encounter for immunization: Secondary | ICD-10-CM | POA: Diagnosis not present

## 2023-09-12 DIAGNOSIS — Z992 Dependence on renal dialysis: Secondary | ICD-10-CM | POA: Diagnosis not present

## 2023-09-12 DIAGNOSIS — N186 End stage renal disease: Secondary | ICD-10-CM | POA: Diagnosis not present

## 2023-09-13 ENCOUNTER — Ambulatory Visit: Admitting: Family Medicine

## 2023-09-14 DIAGNOSIS — Z992 Dependence on renal dialysis: Secondary | ICD-10-CM | POA: Diagnosis not present

## 2023-09-14 DIAGNOSIS — N186 End stage renal disease: Secondary | ICD-10-CM | POA: Diagnosis not present

## 2023-09-16 DIAGNOSIS — N186 End stage renal disease: Secondary | ICD-10-CM | POA: Diagnosis not present

## 2023-09-16 DIAGNOSIS — Z992 Dependence on renal dialysis: Secondary | ICD-10-CM | POA: Diagnosis not present

## 2023-09-19 DIAGNOSIS — N186 End stage renal disease: Secondary | ICD-10-CM | POA: Diagnosis not present

## 2023-09-19 DIAGNOSIS — K660 Peritoneal adhesions (postprocedural) (postinfection): Secondary | ICD-10-CM | POA: Diagnosis not present

## 2023-09-19 DIAGNOSIS — Z992 Dependence on renal dialysis: Secondary | ICD-10-CM | POA: Diagnosis not present

## 2023-09-19 DIAGNOSIS — I120 Hypertensive chronic kidney disease with stage 5 chronic kidney disease or end stage renal disease: Secondary | ICD-10-CM | POA: Diagnosis not present

## 2023-09-21 DIAGNOSIS — N186 End stage renal disease: Secondary | ICD-10-CM | POA: Diagnosis not present

## 2023-09-21 DIAGNOSIS — Z992 Dependence on renal dialysis: Secondary | ICD-10-CM | POA: Diagnosis not present

## 2023-09-26 DIAGNOSIS — Z992 Dependence on renal dialysis: Secondary | ICD-10-CM | POA: Diagnosis not present

## 2023-09-26 DIAGNOSIS — N186 End stage renal disease: Secondary | ICD-10-CM | POA: Diagnosis not present

## 2023-09-28 DIAGNOSIS — Z992 Dependence on renal dialysis: Secondary | ICD-10-CM | POA: Diagnosis not present

## 2023-09-28 DIAGNOSIS — N186 End stage renal disease: Secondary | ICD-10-CM | POA: Diagnosis not present

## 2023-09-29 ENCOUNTER — Encounter: Payer: Self-pay | Admitting: Family Medicine

## 2023-09-29 ENCOUNTER — Ambulatory Visit (INDEPENDENT_AMBULATORY_CARE_PROVIDER_SITE_OTHER): Admitting: Family Medicine

## 2023-09-29 VITALS — BP 142/77 | HR 81 | Ht 65.0 in | Wt 299.0 lb

## 2023-09-29 DIAGNOSIS — Z6841 Body Mass Index (BMI) 40.0 and over, adult: Secondary | ICD-10-CM | POA: Diagnosis not present

## 2023-09-29 DIAGNOSIS — N186 End stage renal disease: Secondary | ICD-10-CM | POA: Diagnosis not present

## 2023-09-29 DIAGNOSIS — Z992 Dependence on renal dialysis: Secondary | ICD-10-CM

## 2023-09-29 DIAGNOSIS — G4733 Obstructive sleep apnea (adult) (pediatric): Secondary | ICD-10-CM | POA: Diagnosis not present

## 2023-09-29 DIAGNOSIS — I10 Essential (primary) hypertension: Secondary | ICD-10-CM | POA: Diagnosis not present

## 2023-09-29 MED ORDER — CLONIDINE 0.2 MG/24HR TD PTWK: 0.2000 mg | MEDICATED_PATCH | TRANSDERMAL | 3 refills | Status: DC

## 2023-09-29 NOTE — Assessment & Plan Note (Signed)
 Chronic hypertension Improved from >170/>100 to 142/77  She is on multiple antihypertensive medications including clonidine  0.2mg  TID and 0.2mg  patches, hydralazine  10mg  BID, losartan  100mg  daily, torsemide 100mg  daily , and amlodipine  10mg  daily. - Continue current antihypertensive regimen including clonidine , hydralazine , losartan , torsemide, and amlodipine . - Refill clonidine  patches today.

## 2023-09-29 NOTE — Progress Notes (Signed)
 Established patient visit   Patient: Sierra Lee   DOB: 1990-09-15   32 y.o. Female  MRN: [id number] Visit Date: 09/29/2023  Today's healthcare provider: Rockie Agent, MD   Chief Complaint  Patient presents with   Hypertension   Subjective       Discussed the use of AI scribe software for clinical note transcription with the patient, who gave verbal consent to proceed.  History of Present Illness Sierra Lee is a 33 year old female with hypertension who presents for follow-up on her blood pressure management.  She has been experiencing severely elevated blood pressure, with a recent reading of 176/108 mmHg. She is frustrated with her inability to control her blood pressure despite using clonidine  patches, which she applies once a week.  She has end-stage renal disease and is on dialysis. She underwent dialysis yesterday and had a flush today. She is working with a transplant team and began her transplant process on the 28th. She receives dialysis treatment at [facility], and will be transitioning to [place].  She is managing obesity with Wegovy , currently at a dose of 1.7 mg, and has lost 11 pounds since May. She paused the medication for two weeks due to surgery but resumed it last Friday. She has two more refills of the current dose.  Regarding her vaccination history, she received pneumonia and hepatitis vaccines last week and has an updated tetanus vaccine. She inquires about scheduling a Pap smear.     Past Medical History:  Diagnosis Date   Allergy    Anemia    Dialysis patient (HCC)    M, W, F   Dyspnea    ESRD (end stage renal disease) (HCC)    Former cigarette smoker    GERD (gastroesophageal reflux disease)    Hypertension    Morbid obesity (HCC)    OSA on CPAP     Medications: Outpatient Medications Prior to Visit  Medication Sig   acetaminophen  (TYLENOL ) 325 MG tablet Take 2 tablets (650 mg total) by mouth every 6 (six)  hours as needed for mild pain (pain score 1-3) (or Fever >/= 101).   albuterol  (VENTOLIN  HFA) 108 (90 Base) MCG/ACT inhaler Inhale 2 puffs into the lungs every 6 (six) hours as needed for wheezing or shortness of breath.   amLODipine  (NORVASC ) 10 MG tablet Take 1 tablet (10 mg total) by mouth daily. (Patient taking differently: Take 10 mg by mouth every morning.)   calcitRIOL (ROCALTROL) 0.25 MCG capsule Take 0.25 mcg by mouth daily.   calcium acetate (PHOSLO) 667 MG capsule Take 667 mg by mouth 3 (three) times daily with meals.   cloNIDine  (CATAPRES ) 0.2 MG tablet Take 1 tablet (0.2 mg total) by mouth 3 (three) times daily.   hydrALAZINE  (APRESOLINE ) 25 MG tablet Take 1 tablet (25 mg total) by mouth 2 (two) times daily.   losartan  (COZAAR ) 100 MG tablet Take 1 tablet by mouth daily.   multivitamin (RENA-VIT) TABS tablet Take 1 tablet by mouth daily.   [START ON 10/26/2023] Semaglutide -Weight Management (WEGOVY ) 1.7 MG/0.75ML SOAJ Inject 1.7 mg into the skin once a week.   Semaglutide -Weight Management 1 MG/0.5ML SOAJ Inject 1 mg into the skin once a week.   torsemide (DEMADEX) 100 MG tablet Take 100 mg by mouth every morning.   [DISCONTINUED] cloNIDine  (CATAPRES  - DOSED IN MG/24 HR) 0.2 mg/24hr patch Place 1 patch (0.2 mg total) onto the skin once a week.   No facility-administered medications prior to  visit.    Review of Systems  Last metabolic panel Lab Results  Component Value Date   GLUCOSE 103 (H) 06/14/2023   NA 138 06/14/2023   K 4.0 06/14/2023   CL 104 06/14/2023   CO2 22 06/14/2023   BUN 35 (H) 06/14/2023   CREATININE 11.41 (H) 06/14/2023   GFRNONAA 4 (L) 06/14/2023   CALCIUM 7.4 (L) 07/06/2023   PHOS 6.6 (H) 03/17/2023   PROT 7.1 06/14/2023   ALBUMIN 2.9 (L) 06/14/2023   LABGLOB 2.7 02/02/2023   AGRATIO 1.0 (L) 08/18/2022   BILITOT 0.7 06/14/2023   ALKPHOS 42 06/14/2023   AST 14 (L) 06/14/2023   ALT 12 06/14/2023   ANIONGAP 12 06/14/2023   Last lipids Lab Results   Component Value Date   CHOL 125 08/18/2022   HDL 36 (L) 08/18/2022   LDLCALC 66 08/18/2022   TRIG 131 08/18/2022   CHOLHDL 3.5 08/18/2022   Last hemoglobin A1c Lab Results  Component Value Date   HGBA1C 5.5 01/26/2023        Objective    BP (!) 142/77   Pulse 81   Ht 5' 5 (1.651 m)   Wt 299 lb (135.6 kg)   SpO2 (!) 20%   BMI 49.76 kg/m  BP Readings from Last 3 Encounters:  09/29/23 (!) 142/77  08/29/23 (!) 135/103  07/26/23 (!) 128/99   Wt Readings from Last 3 Encounters:  09/29/23 299 lb (135.6 kg)  08/29/23 (!) 304 lb (137.9 kg)  07/26/23 (!) 310 lb (140.6 kg)        Physical Exam Vitals reviewed.  Constitutional:      General: She is not in acute distress.    Appearance: Normal appearance. She is not ill-appearing.  Cardiovascular:     Rate and Rhythm: Normal rate and regular rhythm.  Pulmonary:     Effort: Pulmonary effort is normal. No respiratory distress.     Breath sounds: No wheezing, rhonchi or rales.  Neurological:     Mental Status: She is alert and oriented to person, place, and time.  Psychiatric:        Mood and Affect: Mood normal.        Behavior: Behavior normal.       No results found for any visits on 09/29/23.  Assessment & Plan     Problem List Items Addressed This Visit       Cardiovascular and Mediastinum   Primary hypertension   Chronic hypertension Improved from >170/>100 to 142/77  She is on multiple antihypertensive medications including clonidine  0.2mg  TID and 0.2mg  patches, hydralazine  10mg  BID, losartan  100mg  daily, torsemide 100mg  daily , and amlodipine  10mg  daily. - Continue current antihypertensive regimen including clonidine , hydralazine , losartan , torsemide, and amlodipine . - Refill clonidine  patches today.      Relevant Medications   cloNIDine  (CATAPRES  - DOSED IN MG/24 HR) 0.2 mg/24hr patch     Respiratory   Severe obstructive sleep apnea     Genitourinary   ESRD (end stage renal disease) on  dialysis (HCC)     Other   Morbid obesity with BMI of 50.0-59.9, adult [facility]) - Primary    Assessment & Plan   End-Stage Renal Disease (ESRD) on Dialysis Undergoing dialysis and working with a transplant team. Dialysis received at [facility], transitioning to [facility]. - Continue dialysis treatment at [facility]  Obesity On Wegovy  1.7 mg for weight management, with a weight loss of 11 pounds since May. Current regimen will be continued. - Continue Wegovy   1.7 mg with current refills  General Health Maintenance Received pneumonia and hepatitis vaccines last week, pending system update. Due for Pap smear for cervical cancer screening. - Schedule Pap smear for cervical cancer screening - Update vaccination records to reflect recent pneumonia and hepatitis vaccines     Return in about 8 weeks (around 11/24/2023) for pap smear , CPE.       [facility] request Pna and HepB vaccines from last week   Rockie Agent, MD  [facility] [phone] (phone) [phone] (fax)  [facility]

## 2023-09-30 DIAGNOSIS — Z992 Dependence on renal dialysis: Secondary | ICD-10-CM | POA: Diagnosis not present

## 2023-09-30 DIAGNOSIS — N186 End stage renal disease: Secondary | ICD-10-CM | POA: Diagnosis not present

## 2023-10-03 DIAGNOSIS — G4733 Obstructive sleep apnea (adult) (pediatric): Secondary | ICD-10-CM | POA: Diagnosis not present

## 2023-10-03 DIAGNOSIS — I10 Essential (primary) hypertension: Secondary | ICD-10-CM | POA: Diagnosis not present

## 2023-10-03 DIAGNOSIS — N186 End stage renal disease: Secondary | ICD-10-CM | POA: Diagnosis not present

## 2023-10-03 DIAGNOSIS — Z6841 Body Mass Index (BMI) 40.0 and over, adult: Secondary | ICD-10-CM | POA: Diagnosis not present

## 2023-10-03 DIAGNOSIS — E66813 Obesity, class 3: Secondary | ICD-10-CM | POA: Diagnosis not present

## 2023-10-03 DIAGNOSIS — I120 Hypertensive chronic kidney disease with stage 5 chronic kidney disease or end stage renal disease: Secondary | ICD-10-CM | POA: Diagnosis not present

## 2023-10-03 DIAGNOSIS — Z992 Dependence on renal dialysis: Secondary | ICD-10-CM | POA: Diagnosis not present

## 2023-10-04 DIAGNOSIS — Z992 Dependence on renal dialysis: Secondary | ICD-10-CM | POA: Diagnosis not present

## 2023-10-04 DIAGNOSIS — N186 End stage renal disease: Secondary | ICD-10-CM | POA: Diagnosis not present

## 2023-10-05 DIAGNOSIS — N186 End stage renal disease: Secondary | ICD-10-CM | POA: Diagnosis not present

## 2023-10-05 DIAGNOSIS — Z992 Dependence on renal dialysis: Secondary | ICD-10-CM | POA: Diagnosis not present

## 2023-10-07 DIAGNOSIS — N186 End stage renal disease: Secondary | ICD-10-CM | POA: Diagnosis not present

## 2023-10-07 DIAGNOSIS — Z992 Dependence on renal dialysis: Secondary | ICD-10-CM | POA: Diagnosis not present

## 2023-10-10 DIAGNOSIS — Z992 Dependence on renal dialysis: Secondary | ICD-10-CM | POA: Diagnosis not present

## 2023-10-10 DIAGNOSIS — N186 End stage renal disease: Secondary | ICD-10-CM | POA: Diagnosis not present

## 2023-10-11 DIAGNOSIS — N186 End stage renal disease: Secondary | ICD-10-CM | POA: Diagnosis not present

## 2023-10-11 DIAGNOSIS — Z992 Dependence on renal dialysis: Secondary | ICD-10-CM | POA: Diagnosis not present

## 2023-10-11 DIAGNOSIS — E785 Hyperlipidemia, unspecified: Secondary | ICD-10-CM | POA: Diagnosis not present

## 2023-10-11 DIAGNOSIS — E1122 Type 2 diabetes mellitus with diabetic chronic kidney disease: Secondary | ICD-10-CM | POA: Diagnosis not present

## 2023-10-12 DIAGNOSIS — Z992 Dependence on renal dialysis: Secondary | ICD-10-CM | POA: Diagnosis not present

## 2023-10-12 DIAGNOSIS — N186 End stage renal disease: Secondary | ICD-10-CM | POA: Diagnosis not present

## 2023-10-13 DIAGNOSIS — N186 End stage renal disease: Secondary | ICD-10-CM | POA: Diagnosis not present

## 2023-10-13 DIAGNOSIS — Z992 Dependence on renal dialysis: Secondary | ICD-10-CM | POA: Diagnosis not present

## 2023-10-14 DIAGNOSIS — Z992 Dependence on renal dialysis: Secondary | ICD-10-CM | POA: Diagnosis not present

## 2023-10-14 DIAGNOSIS — N186 End stage renal disease: Secondary | ICD-10-CM | POA: Diagnosis not present

## 2023-10-17 DIAGNOSIS — N186 End stage renal disease: Secondary | ICD-10-CM | POA: Diagnosis not present

## 2023-10-17 DIAGNOSIS — Z992 Dependence on renal dialysis: Secondary | ICD-10-CM | POA: Diagnosis not present

## 2023-10-18 DIAGNOSIS — Z992 Dependence on renal dialysis: Secondary | ICD-10-CM | POA: Diagnosis not present

## 2023-10-18 DIAGNOSIS — N186 End stage renal disease: Secondary | ICD-10-CM | POA: Diagnosis not present

## 2023-10-19 ENCOUNTER — Ambulatory Visit: Payer: Self-pay

## 2023-10-19 NOTE — Telephone Encounter (Signed)
 Noted

## 2023-10-19 NOTE — Telephone Encounter (Signed)
 FYI Only or Action Required?: FYI only for provider.  Patient was last seen in primary care on 09/29/2023 by Sharma Coyer, MD.  Called Nurse Triage reporting Vomiting.  Symptoms began several days ago.  Interventions attempted: Nothing.  Symptoms are: gradually worsening.  Triage Disposition: See Physician Within 24 Hours  Patient/caregiver understands and will follow disposition?: Call disconnected during Care Advise and appt aconfirmation. RN attempted to callback, no answer, LVM  Copied from CRM [id number]. Topic: Clinical - Red Word Triage >> Oct 19, 2023 11:00 AM Antwanette L wrote: Red Word that prompted transfer to Nurse Triage: Pt has been vomitting for 4 day. Pt is also having diarrhea Reason for Disposition  [1] MILD or MODERATE vomiting AND [2] present > 48 hours (2 days)  (Exception: Mild vomiting with associated diarrhea.)  Answer Assessment - Initial Assessment Questions 1. VOMITING SEVERITY: How many times have you vomited in the past 24 hours?      Only vomiting in morning, 3 x in last 24 hours  2. ONSET: When did the vomiting begin?      4 days ago  3. FLUIDS: What fluids or food have you vomited up today? Have you been able to keep any fluids down?     Unable to keep fluids down  4. ABDOMEN PAIN: Are your having any abdomen pain? If Yes : How bad is it and what does it feel like? (e.g., crampy, dull, intermittent, constant)      Intermittent sharps in stomach followed by diarrhea  5. DIARRHEA: Is there any diarrhea? If Yes, ask: How many times today?      3x times today  6. CONTACTS: Is there anyone else in the family with the same symptoms?      No  7. CAUSE: What do you think is causing your vomiting?     Unsure of cause, but recently imcreased Wegovy  dose. Also on PD at home  8. HYDRATION STATUS: Any signs of dehydration? (e.g., dry mouth [not only dry lips], too weak to stand) When did you last urinate?     Last  urinated 5 minutes ago  9. OTHER SYMPTOMS: Do you have any other symptoms? (e.g., fever, headache, vertigo, vomiting blood or coffee grounds, recent head injury)     Hot flashes and dizziness (intermittent)  10. PREGNANCY: Is there any chance you are pregnant? When was your last menstrual period?       Just ended yesterday  Protocols used: Vomiting-A-AH

## 2023-10-20 ENCOUNTER — Ambulatory Visit: Admitting: Family Medicine

## 2023-10-20 DIAGNOSIS — Z992 Dependence on renal dialysis: Secondary | ICD-10-CM | POA: Diagnosis not present

## 2023-10-20 DIAGNOSIS — N186 End stage renal disease: Secondary | ICD-10-CM | POA: Diagnosis not present

## 2023-10-21 DIAGNOSIS — N186 End stage renal disease: Secondary | ICD-10-CM | POA: Diagnosis not present

## 2023-10-21 DIAGNOSIS — Z992 Dependence on renal dialysis: Secondary | ICD-10-CM | POA: Diagnosis not present

## 2023-10-22 DIAGNOSIS — N186 End stage renal disease: Secondary | ICD-10-CM | POA: Diagnosis not present

## 2023-10-22 DIAGNOSIS — Z992 Dependence on renal dialysis: Secondary | ICD-10-CM | POA: Diagnosis not present

## 2023-10-23 DIAGNOSIS — N186 End stage renal disease: Secondary | ICD-10-CM | POA: Diagnosis not present

## 2023-10-23 DIAGNOSIS — Z992 Dependence on renal dialysis: Secondary | ICD-10-CM | POA: Diagnosis not present

## 2023-10-24 DIAGNOSIS — N186 End stage renal disease: Secondary | ICD-10-CM | POA: Diagnosis not present

## 2023-10-24 DIAGNOSIS — Z992 Dependence on renal dialysis: Secondary | ICD-10-CM | POA: Diagnosis not present

## 2023-10-25 ENCOUNTER — Encounter: Payer: Self-pay | Admitting: Family Medicine

## 2023-10-25 DIAGNOSIS — Z992 Dependence on renal dialysis: Secondary | ICD-10-CM | POA: Diagnosis not present

## 2023-10-25 DIAGNOSIS — N186 End stage renal disease: Secondary | ICD-10-CM | POA: Diagnosis not present

## 2023-10-26 DIAGNOSIS — Z992 Dependence on renal dialysis: Secondary | ICD-10-CM | POA: Diagnosis not present

## 2023-10-26 DIAGNOSIS — N186 End stage renal disease: Secondary | ICD-10-CM | POA: Diagnosis not present

## 2023-10-27 DIAGNOSIS — N186 End stage renal disease: Secondary | ICD-10-CM | POA: Diagnosis not present

## 2023-10-27 DIAGNOSIS — Z992 Dependence on renal dialysis: Secondary | ICD-10-CM | POA: Diagnosis not present

## 2023-10-28 DIAGNOSIS — N186 End stage renal disease: Secondary | ICD-10-CM | POA: Diagnosis not present

## 2023-10-28 DIAGNOSIS — Z992 Dependence on renal dialysis: Secondary | ICD-10-CM | POA: Diagnosis not present

## 2023-10-29 DIAGNOSIS — Z992 Dependence on renal dialysis: Secondary | ICD-10-CM | POA: Diagnosis not present

## 2023-10-29 DIAGNOSIS — N186 End stage renal disease: Secondary | ICD-10-CM | POA: Diagnosis not present

## 2023-10-30 DIAGNOSIS — N186 End stage renal disease: Secondary | ICD-10-CM | POA: Diagnosis not present

## 2023-10-30 DIAGNOSIS — Z992 Dependence on renal dialysis: Secondary | ICD-10-CM | POA: Diagnosis not present

## 2023-10-31 DIAGNOSIS — N186 End stage renal disease: Secondary | ICD-10-CM | POA: Diagnosis not present

## 2023-10-31 DIAGNOSIS — Z992 Dependence on renal dialysis: Secondary | ICD-10-CM | POA: Diagnosis not present

## 2023-11-01 DIAGNOSIS — Z992 Dependence on renal dialysis: Secondary | ICD-10-CM | POA: Diagnosis not present

## 2023-11-01 DIAGNOSIS — N186 End stage renal disease: Secondary | ICD-10-CM | POA: Diagnosis not present

## 2023-11-02 ENCOUNTER — Other Ambulatory Visit: Payer: Self-pay | Admitting: Family Medicine

## 2023-11-02 DIAGNOSIS — N186 End stage renal disease: Secondary | ICD-10-CM | POA: Diagnosis not present

## 2023-11-02 DIAGNOSIS — Z992 Dependence on renal dialysis: Secondary | ICD-10-CM | POA: Diagnosis not present

## 2023-11-02 DIAGNOSIS — Z6841 Body Mass Index (BMI) 40.0 and over, adult: Secondary | ICD-10-CM

## 2023-11-03 DIAGNOSIS — G4733 Obstructive sleep apnea (adult) (pediatric): Secondary | ICD-10-CM | POA: Diagnosis not present

## 2023-11-03 DIAGNOSIS — Z992 Dependence on renal dialysis: Secondary | ICD-10-CM | POA: Diagnosis not present

## 2023-11-03 DIAGNOSIS — N186 End stage renal disease: Secondary | ICD-10-CM | POA: Diagnosis not present

## 2023-11-04 DIAGNOSIS — Z992 Dependence on renal dialysis: Secondary | ICD-10-CM | POA: Diagnosis not present

## 2023-11-04 DIAGNOSIS — N186 End stage renal disease: Secondary | ICD-10-CM | POA: Diagnosis not present

## 2023-11-05 DIAGNOSIS — Z992 Dependence on renal dialysis: Secondary | ICD-10-CM | POA: Diagnosis not present

## 2023-11-05 DIAGNOSIS — N186 End stage renal disease: Secondary | ICD-10-CM | POA: Diagnosis not present

## 2023-11-06 DIAGNOSIS — N186 End stage renal disease: Secondary | ICD-10-CM | POA: Diagnosis not present

## 2023-11-06 DIAGNOSIS — Z992 Dependence on renal dialysis: Secondary | ICD-10-CM | POA: Diagnosis not present

## 2023-11-07 ENCOUNTER — Other Ambulatory Visit: Payer: Self-pay | Admitting: Family Medicine

## 2023-11-07 DIAGNOSIS — N186 End stage renal disease: Secondary | ICD-10-CM | POA: Diagnosis not present

## 2023-11-07 DIAGNOSIS — Z992 Dependence on renal dialysis: Secondary | ICD-10-CM | POA: Diagnosis not present

## 2023-11-08 DIAGNOSIS — I10 Essential (primary) hypertension: Secondary | ICD-10-CM | POA: Diagnosis not present

## 2023-11-08 DIAGNOSIS — Z992 Dependence on renal dialysis: Secondary | ICD-10-CM | POA: Diagnosis not present

## 2023-11-08 DIAGNOSIS — E66813 Obesity, class 3: Secondary | ICD-10-CM | POA: Diagnosis not present

## 2023-11-08 DIAGNOSIS — Z6841 Body Mass Index (BMI) 40.0 and over, adult: Secondary | ICD-10-CM | POA: Diagnosis not present

## 2023-11-08 DIAGNOSIS — N186 End stage renal disease: Secondary | ICD-10-CM | POA: Diagnosis not present

## 2023-11-08 DIAGNOSIS — Z01818 Encounter for other preprocedural examination: Secondary | ICD-10-CM | POA: Diagnosis not present

## 2023-11-09 DIAGNOSIS — Z992 Dependence on renal dialysis: Secondary | ICD-10-CM | POA: Diagnosis not present

## 2023-11-09 DIAGNOSIS — N186 End stage renal disease: Secondary | ICD-10-CM | POA: Diagnosis not present

## 2023-11-10 DIAGNOSIS — N186 End stage renal disease: Secondary | ICD-10-CM | POA: Diagnosis not present

## 2023-11-10 DIAGNOSIS — Z992 Dependence on renal dialysis: Secondary | ICD-10-CM | POA: Diagnosis not present

## 2023-11-11 DIAGNOSIS — N186 End stage renal disease: Secondary | ICD-10-CM | POA: Diagnosis not present

## 2023-11-11 DIAGNOSIS — Z992 Dependence on renal dialysis: Secondary | ICD-10-CM | POA: Diagnosis not present

## 2023-11-12 DIAGNOSIS — N186 End stage renal disease: Secondary | ICD-10-CM | POA: Diagnosis not present

## 2023-11-12 DIAGNOSIS — Z992 Dependence on renal dialysis: Secondary | ICD-10-CM | POA: Diagnosis not present

## 2023-11-13 DIAGNOSIS — N186 End stage renal disease: Secondary | ICD-10-CM | POA: Diagnosis not present

## 2023-11-13 DIAGNOSIS — Z992 Dependence on renal dialysis: Secondary | ICD-10-CM | POA: Diagnosis not present

## 2023-11-14 DIAGNOSIS — N186 End stage renal disease: Secondary | ICD-10-CM | POA: Diagnosis not present

## 2023-11-14 DIAGNOSIS — Z992 Dependence on renal dialysis: Secondary | ICD-10-CM | POA: Diagnosis not present

## 2023-11-15 DIAGNOSIS — Z992 Dependence on renal dialysis: Secondary | ICD-10-CM | POA: Diagnosis not present

## 2023-11-15 DIAGNOSIS — N186 End stage renal disease: Secondary | ICD-10-CM | POA: Diagnosis not present

## 2023-11-16 DIAGNOSIS — Z992 Dependence on renal dialysis: Secondary | ICD-10-CM | POA: Diagnosis not present

## 2023-11-16 DIAGNOSIS — N186 End stage renal disease: Secondary | ICD-10-CM | POA: Diagnosis not present

## 2023-11-17 DIAGNOSIS — Z992 Dependence on renal dialysis: Secondary | ICD-10-CM | POA: Diagnosis not present

## 2023-11-17 DIAGNOSIS — N186 End stage renal disease: Secondary | ICD-10-CM | POA: Diagnosis not present

## 2023-11-18 DIAGNOSIS — Z992 Dependence on renal dialysis: Secondary | ICD-10-CM | POA: Diagnosis not present

## 2023-11-18 DIAGNOSIS — N186 End stage renal disease: Secondary | ICD-10-CM | POA: Diagnosis not present

## 2023-11-19 DIAGNOSIS — Z992 Dependence on renal dialysis: Secondary | ICD-10-CM | POA: Diagnosis not present

## 2023-11-19 DIAGNOSIS — N186 End stage renal disease: Secondary | ICD-10-CM | POA: Diagnosis not present

## 2023-11-20 DIAGNOSIS — N186 End stage renal disease: Secondary | ICD-10-CM | POA: Diagnosis not present

## 2023-11-20 DIAGNOSIS — Z992 Dependence on renal dialysis: Secondary | ICD-10-CM | POA: Diagnosis not present

## 2023-11-21 DIAGNOSIS — N186 End stage renal disease: Secondary | ICD-10-CM | POA: Diagnosis not present

## 2023-11-21 DIAGNOSIS — Z992 Dependence on renal dialysis: Secondary | ICD-10-CM | POA: Diagnosis not present

## 2023-11-22 DIAGNOSIS — Z992 Dependence on renal dialysis: Secondary | ICD-10-CM | POA: Diagnosis not present

## 2023-11-22 DIAGNOSIS — N186 End stage renal disease: Secondary | ICD-10-CM | POA: Diagnosis not present

## 2023-11-23 DIAGNOSIS — Z992 Dependence on renal dialysis: Secondary | ICD-10-CM | POA: Diagnosis not present

## 2023-11-23 DIAGNOSIS — N186 End stage renal disease: Secondary | ICD-10-CM | POA: Diagnosis not present

## 2023-11-24 DIAGNOSIS — Z992 Dependence on renal dialysis: Secondary | ICD-10-CM | POA: Diagnosis not present

## 2023-11-24 DIAGNOSIS — N186 End stage renal disease: Secondary | ICD-10-CM | POA: Diagnosis not present

## 2023-11-25 DIAGNOSIS — Z992 Dependence on renal dialysis: Secondary | ICD-10-CM | POA: Diagnosis not present

## 2023-11-25 DIAGNOSIS — N186 End stage renal disease: Secondary | ICD-10-CM | POA: Diagnosis not present

## 2023-11-26 DIAGNOSIS — Z992 Dependence on renal dialysis: Secondary | ICD-10-CM | POA: Diagnosis not present

## 2023-11-26 DIAGNOSIS — N186 End stage renal disease: Secondary | ICD-10-CM | POA: Diagnosis not present

## 2023-11-27 DIAGNOSIS — N186 End stage renal disease: Secondary | ICD-10-CM | POA: Diagnosis not present

## 2023-11-27 DIAGNOSIS — Z992 Dependence on renal dialysis: Secondary | ICD-10-CM | POA: Diagnosis not present

## 2023-11-28 DIAGNOSIS — Z992 Dependence on renal dialysis: Secondary | ICD-10-CM | POA: Diagnosis not present

## 2023-11-28 DIAGNOSIS — N186 End stage renal disease: Secondary | ICD-10-CM | POA: Diagnosis not present

## 2023-11-29 DIAGNOSIS — N186 End stage renal disease: Secondary | ICD-10-CM | POA: Diagnosis not present

## 2023-11-29 DIAGNOSIS — Z992 Dependence on renal dialysis: Secondary | ICD-10-CM | POA: Diagnosis not present

## 2023-11-30 DIAGNOSIS — N186 End stage renal disease: Secondary | ICD-10-CM | POA: Diagnosis not present

## 2023-11-30 DIAGNOSIS — Z992 Dependence on renal dialysis: Secondary | ICD-10-CM | POA: Diagnosis not present

## 2023-12-01 DIAGNOSIS — N186 End stage renal disease: Secondary | ICD-10-CM | POA: Diagnosis not present

## 2023-12-01 DIAGNOSIS — Z992 Dependence on renal dialysis: Secondary | ICD-10-CM | POA: Diagnosis not present

## 2023-12-02 DIAGNOSIS — Z992 Dependence on renal dialysis: Secondary | ICD-10-CM | POA: Diagnosis not present

## 2023-12-02 DIAGNOSIS — N186 End stage renal disease: Secondary | ICD-10-CM | POA: Diagnosis not present

## 2023-12-03 DIAGNOSIS — Z992 Dependence on renal dialysis: Secondary | ICD-10-CM | POA: Diagnosis not present

## 2023-12-03 DIAGNOSIS — N186 End stage renal disease: Secondary | ICD-10-CM | POA: Diagnosis not present

## 2023-12-04 DIAGNOSIS — N186 End stage renal disease: Secondary | ICD-10-CM | POA: Diagnosis not present

## 2023-12-04 DIAGNOSIS — G4733 Obstructive sleep apnea (adult) (pediatric): Secondary | ICD-10-CM | POA: Diagnosis not present

## 2023-12-04 DIAGNOSIS — Z992 Dependence on renal dialysis: Secondary | ICD-10-CM | POA: Diagnosis not present

## 2023-12-05 DIAGNOSIS — N186 End stage renal disease: Secondary | ICD-10-CM | POA: Diagnosis not present

## 2023-12-05 DIAGNOSIS — Z992 Dependence on renal dialysis: Secondary | ICD-10-CM | POA: Diagnosis not present

## 2023-12-06 DIAGNOSIS — Z992 Dependence on renal dialysis: Secondary | ICD-10-CM | POA: Diagnosis not present

## 2023-12-06 DIAGNOSIS — N186 End stage renal disease: Secondary | ICD-10-CM | POA: Diagnosis not present

## 2023-12-07 DIAGNOSIS — Z992 Dependence on renal dialysis: Secondary | ICD-10-CM | POA: Diagnosis not present

## 2023-12-07 DIAGNOSIS — N186 End stage renal disease: Secondary | ICD-10-CM | POA: Diagnosis not present

## 2023-12-08 DIAGNOSIS — N186 End stage renal disease: Secondary | ICD-10-CM | POA: Diagnosis not present

## 2023-12-08 DIAGNOSIS — Z992 Dependence on renal dialysis: Secondary | ICD-10-CM | POA: Diagnosis not present

## 2023-12-09 ENCOUNTER — Telehealth (INDEPENDENT_AMBULATORY_CARE_PROVIDER_SITE_OTHER): Payer: Self-pay

## 2023-12-09 DIAGNOSIS — Z992 Dependence on renal dialysis: Secondary | ICD-10-CM | POA: Diagnosis not present

## 2023-12-09 DIAGNOSIS — N186 End stage renal disease: Secondary | ICD-10-CM | POA: Diagnosis not present

## 2023-12-09 NOTE — Telephone Encounter (Signed)
 Spoke with the patient and she is scheduled with Dr. Marea on 12/12/23 for a permcath removal with a 2:15 pm arrival time to the [facility]. Pre-procedure instructions were discussed and will be sent to Mychart and patient stated she understood.

## 2023-12-10 DIAGNOSIS — N186 End stage renal disease: Secondary | ICD-10-CM | POA: Diagnosis not present

## 2023-12-10 DIAGNOSIS — Z992 Dependence on renal dialysis: Secondary | ICD-10-CM | POA: Diagnosis not present

## 2023-12-11 DIAGNOSIS — N186 End stage renal disease: Secondary | ICD-10-CM | POA: Diagnosis not present

## 2023-12-11 DIAGNOSIS — Z992 Dependence on renal dialysis: Secondary | ICD-10-CM | POA: Diagnosis not present

## 2023-12-12 ENCOUNTER — Ambulatory Visit
Admission: RE | Admit: 2023-12-12 | Discharge: 2023-12-12 | Disposition: A | Discharge status: Home / Self Care | Attending: Vascular Surgery | Admitting: Vascular Surgery

## 2023-12-12 ENCOUNTER — Encounter: Payer: Self-pay | Admitting: Vascular Surgery

## 2023-12-12 ENCOUNTER — Encounter
Admission: RE | Disposition: A | Discharge status: Home / Self Care | Payer: Self-pay | Source: Home / Self Care | Attending: Vascular Surgery

## 2023-12-12 DIAGNOSIS — Z992 Dependence on renal dialysis: Secondary | ICD-10-CM

## 2023-12-12 DIAGNOSIS — Z452 Encounter for adjustment and management of vascular access device: Secondary | ICD-10-CM | POA: Diagnosis not present

## 2023-12-12 DIAGNOSIS — N186 End stage renal disease: Secondary | ICD-10-CM | POA: Diagnosis not present

## 2023-12-12 HISTORY — PX: DIALYSIS/PERMA CATHETER REMOVAL: CATH118289

## 2023-12-12 SURGERY — DIALYSIS/PERMA CATHETER REMOVAL
Anesthesia: LOCAL

## 2023-12-12 MED ORDER — LIDOCAINE-EPINEPHRINE (PF) 1 %-1:200000 IJ SOLN
INTRAMUSCULAR | Status: DC | PRN
  Administered 2023-12-12: 20 mL

## 2023-12-12 SURGICAL SUPPLY — 3 items
CHLORAPREP W/TINT 26 (MISCELLANEOUS) IMPLANT
FORCEPS FG STRG 5.5XNS LF DISP (INSTRUMENTS) IMPLANT
TRAY LACERAT/PLASTIC (MISCELLANEOUS) IMPLANT

## 2023-12-12 NOTE — Discharge Instructions (Signed)
 Tunneled Catheter Removal, Care After Refer to this sheet in the next few weeks. These instructions provide you with information about caring for yourself after your procedure. Your health care provider may also give you more specific instructions. Your treatment has been planned according to current medical practices, but problems sometimes occur. Call your health care provider if you have any problems or questions after your procedure. What can I expect after the procedure? After the procedure, it is common to have: Some mild redness, swelling, and pain around your catheter site.   Follow these instructions at home: Incision care  Check your removal site  every day for signs of infection. Check for: More redness, swelling, or pain. More fluid or blood. Warmth. Pus or a bad smell. Remove your dressing in 48hrs leave open to air  Activity  Return to your normal activities as told by your health care provider. Ask your health care provider what activities are safe for you. Do not lift anything that is heavier than 10 lb (4.5 kg) for 3 days  You may shower tomorrow  Contact a health care provider if: You have more fluid or blood coming from your removal site You have more redness, swelling, or pain at your incisions or around the area where your catheter was removed Your removal site feel warm to the touch. You feel unusually weak. You feel nauseous.. Get help right away if You have swelling in your arm, shoulder, neck, or face. You develop chest pain. You have difficulty breathing. You feel dizzy or light-headed. You have pus or a bad smell coming from your removal site You have a fever. You develop bleeding from your removal site, and your bleeding does not stop. This information is not intended to replace advice given to you by your health care provider. Make sure you discuss any questions you have with your health care provider. Document Released: 02/16/2012 Document Revised:  11/02/2015 Document Reviewed: 11/25/2014 Elsevier Interactive Patient Education  2017 [employer].

## 2023-12-12 NOTE — Op Note (Signed)
 Operative Note     Preoperative diagnosis:   1. ESRD with functional permanent access  Postoperative diagnosis:  1. ESRD with functional permanent access  Procedure:  Removal of right jugular Permcath  Surgeon:  Selinda Gu, MD  Anesthesia:  Local  EBL:  Minimal  Indication for the Procedure:  The patient has a functional permanent dialysis access and no longer needs their permcath.  This can be removed.  Risks and benefits are discussed and informed consent is obtained.  Description of the Procedure:  The patient's right neck, chest and existing catheter were sterilely prepped and draped. The area around the catheter was anesthetized copiously with 1% lidocaine . The catheter was dissected out with curved hemostats until the cuff was freed from the surrounding fibrous sheath. The fiber sheath was transected, and the catheter was then removed in its entirety using gentle traction. Pressure was held and sterile dressings were placed. The patient tolerated the procedure well and was taken to the recovery room in stable condition.     Selinda Gu  12/12/2023, 3:24 PM This note was created with Dragon Medical transcription system. Any errors in dictation are purely unintentional.

## 2023-12-13 DIAGNOSIS — Z992 Dependence on renal dialysis: Secondary | ICD-10-CM | POA: Diagnosis not present

## 2023-12-13 DIAGNOSIS — N186 End stage renal disease: Secondary | ICD-10-CM | POA: Diagnosis not present

## 2023-12-14 DIAGNOSIS — Z992 Dependence on renal dialysis: Secondary | ICD-10-CM | POA: Diagnosis not present

## 2023-12-14 DIAGNOSIS — N186 End stage renal disease: Secondary | ICD-10-CM | POA: Diagnosis not present

## 2023-12-15 ENCOUNTER — Telehealth: Payer: Self-pay

## 2023-12-15 DIAGNOSIS — E1122 Type 2 diabetes mellitus with diabetic chronic kidney disease: Secondary | ICD-10-CM | POA: Diagnosis not present

## 2023-12-15 DIAGNOSIS — Z992 Dependence on renal dialysis: Secondary | ICD-10-CM | POA: Diagnosis not present

## 2023-12-15 DIAGNOSIS — N186 End stage renal disease: Secondary | ICD-10-CM | POA: Diagnosis not present

## 2023-12-15 DIAGNOSIS — D509 Iron deficiency anemia, unspecified: Secondary | ICD-10-CM | POA: Diagnosis not present

## 2023-12-15 NOTE — Progress Notes (Signed)
 Complex Care Management Note Care Guide Note  12/15/2023 Name: Sierra Lee MRN: [id number] DOB: 30-Oct-1990   Complex Care Management Outreach Attempts: An unsuccessful telephone outreach was attempted today to offer the patient information about available complex care management services.  Follow Up Plan:  Additional outreach attempts will be made to offer the patient complex care management information and services.   Encounter Outcome:  No Answer  [name]  [facility], [facility] VBCI Assistant Direct Dial: [phone]  Fax: [phone]

## 2023-12-17 DIAGNOSIS — N186 End stage renal disease: Secondary | ICD-10-CM | POA: Diagnosis not present

## 2023-12-18 DIAGNOSIS — N186 End stage renal disease: Secondary | ICD-10-CM | POA: Diagnosis not present

## 2023-12-18 DIAGNOSIS — Z992 Dependence on renal dialysis: Secondary | ICD-10-CM | POA: Diagnosis not present

## 2023-12-19 DIAGNOSIS — Z992 Dependence on renal dialysis: Secondary | ICD-10-CM | POA: Diagnosis not present

## 2023-12-19 DIAGNOSIS — N186 End stage renal disease: Secondary | ICD-10-CM | POA: Diagnosis not present

## 2023-12-19 NOTE — Progress Notes (Unsigned)
 Complex Care Management Note Care Guide Note  12/19/2023 Name: Sierra Lee MRN: [id number] DOB: 07-05-1990   Complex Care Management Outreach Attempts: A second unsuccessful outreach was attempted today to offer the patient with information about available complex care management services.  Follow Up Plan:  Additional outreach attempts will be made to offer the patient complex care management information and services.   Encounter Outcome:  No Answer  [name]  [facility], [facility] VBCI Assistant Direct Dial: [phone]  Fax: [phone]

## 2023-12-20 DIAGNOSIS — N186 End stage renal disease: Secondary | ICD-10-CM | POA: Diagnosis not present

## 2023-12-22 NOTE — Progress Notes (Signed)
 Complex Care Management Note  Care Guide Note 12/22/2023 Name: Sierra Lee MRN: [id number] DOB: [birth year]  Sierra Lee is a 33 y.o. year old female who sees Simmons-Robinson, Rockie, MD for primary care. I reached out to [facility] by phone today to offer complex care management services.  Ms. Mccardle was given information about Complex Care Management services today including:   The Complex Care Management services include support from the care team which includes your Nurse Care Manager, Clinical Social Worker, or Pharmacist.  The Complex Care Management team is here to help remove barriers to the health concerns and goals most important to you. Complex Care Management services are voluntary, and the patient may decline or stop services at any time by request to their care team member.   Complex Care Management Consent Status: Patient did not agree to participate in complex care management services at this time.  Encounter Outcome:  Patient Refused  Dreama Agent [facility] VBCI Assistant Direct Dial: [phone]  Fax: [phone]

## 2023-12-25 DIAGNOSIS — N186 End stage renal disease: Secondary | ICD-10-CM | POA: Diagnosis not present

## 2023-12-25 DIAGNOSIS — Z992 Dependence on renal dialysis: Secondary | ICD-10-CM | POA: Diagnosis not present

## 2023-12-27 DIAGNOSIS — N186 End stage renal disease: Secondary | ICD-10-CM | POA: Diagnosis not present

## 2023-12-27 DIAGNOSIS — Z992 Dependence on renal dialysis: Secondary | ICD-10-CM | POA: Diagnosis not present

## 2023-12-30 DIAGNOSIS — Z992 Dependence on renal dialysis: Secondary | ICD-10-CM | POA: Diagnosis not present

## 2023-12-30 DIAGNOSIS — N186 End stage renal disease: Secondary | ICD-10-CM | POA: Diagnosis not present

## 2024-01-01 DIAGNOSIS — N186 End stage renal disease: Secondary | ICD-10-CM | POA: Diagnosis not present

## 2024-01-02 NOTE — H&P (Signed)
 [facility] Admission History & Physical  MRN : [id number]  Sierra Lee is a 33 y.o. (1990/04/29) female who presents with chief complaint of No chief complaint on file. [name]  History of Present Illness: I am asked to evaluate the patient by the dialysis center. The patient was sent here because they have a nonfunctioning tunneled catheter and a functioning PD catheter.  The patient reports they're not been any problems with any of their dialysis runs. They are reporting good flows with good parameters at dialysis.   Patient denies pain or tenderness overlying the access.  There is no pain with dialysis.  The patient denies hand pain or finger pain consistent with steal syndrome.  No fevers or chills while on dialysis.    No current facility-administered medications for this encounter.   Current Outpatient Medications  Medication Sig Dispense Refill   acetaminophen  (TYLENOL ) 325 MG tablet Take 2 tablets (650 mg total) by mouth every 6 (six) hours as needed for mild pain (pain score 1-3) (or Fever >/= 101).     albuterol  (VENTOLIN  HFA) 108 (90 Base) MCG/ACT inhaler Inhale 2 puffs into the lungs every 6 (six) hours as needed for wheezing or shortness of breath. 8 g 2   amLODipine  (NORVASC ) 10 MG tablet Take 1 tablet (10 mg total) by mouth daily. (Patient taking differently: Take 10 mg by mouth every morning.) 30 tablet 0   calcitRIOL (ROCALTROL) 0.25 MCG capsule Take 0.25 mcg by mouth daily.     calcium acetate (PHOSLO) 667 MG capsule Take 667 mg by mouth 3 (three) times daily with meals.     cloNIDine  (CATAPRES  - DOSED IN MG/24 HR) 0.2 mg/24hr patch Place 1 patch (0.2 mg total) onto the skin once a week. 12 patch 3   cloNIDine  (CATAPRES ) 0.2 MG tablet Take 1 tablet (0.2 mg total) by mouth 3 (three) times daily. 60 tablet 11   hydrALAZINE  (APRESOLINE ) 25 MG tablet Take 1 tablet (25 mg total) by mouth 2 (two) times daily. 120 tablet 3   losartan  (COZAAR ) 100 MG tablet  Take 1 tablet by mouth daily.     multivitamin (RENA-VIT) TABS tablet Take 1 tablet by mouth daily. 90 tablet 3   Semaglutide -Weight Management (WEGOVY ) 1.7 MG/0.75ML SOAJ Inject 1.7 mg into the skin once a week. 3 mL 6   Semaglutide -Weight Management 1 MG/0.5ML SOAJ Inject 1 mg into the skin once a week. 2 mL 0   torsemide (DEMADEX) 100 MG tablet Take 100 mg by mouth every morning.      Past Medical History:  Diagnosis Date   Allergy    Anemia    Dialysis patient    M, W, F   Dyspnea    ESRD (end stage renal disease) (HCC)    Former cigarette smoker    GERD (gastroesophageal reflux disease)    Hypertension    Morbid obesity (HCC)    OSA on CPAP     Past Surgical History:  Procedure Laterality Date   CESAREAN SECTION     x3   CESAREAN SECTION WITH BILATERAL TUBAL LIGATION Bilateral 07/16/2019   Procedure: CESAREAN SECTION WITH BILATERAL TUBAL LIGATION;  Surgeon: Sierra Davies, MD;  Location: [facility] ORS;  Service: Obstetrics;  Laterality: Bilateral;  Repeat C-Section   DIALYSIS/PERMA CATHETER INSERTION N/A 04/18/2023   Procedure: DIALYSIS/PERMA CATHETER INSERTION;  Surgeon: Sierra Selinda RAMAN, MD;  Location: [facility] INVASIVE CV LAB;  Service: Cardiovascular;  Laterality: N/A;   DIALYSIS/PERMA CATHETER REMOVAL N/A 12/12/2023  Procedure: DIALYSIS/PERMA CATHETER REMOVAL;  Surgeon: Sierra Selinda RAMAN, MD;  Location: [facility] INVASIVE CV LAB;  Service: Cardiovascular;  Laterality: N/A;     Social History   Tobacco Use   Smoking status: Former    Current packs/day: 0.00    Types: Cigarettes    Quit date: 10/12/2018    Years since quitting: 5.2   Smokeless tobacco: Never  Vaping Use   Vaping status: Never Used  Substance Use Topics   Alcohol use: Not Currently   Drug use: Never    Family History  Problem Relation Age of Onset   Diabetes Mother    Hypertension Mother    Breast cancer Maternal Aunt    Diabetes Paternal Aunt    Breast cancer Maternal Grandmother     No family history of  bleeding or clotting disorders, autoimmune disease or porphyria  Allergies  Allergen Reactions   Bromelains Anaphylaxis   Other Anaphylaxis, Hives and Rash    Pineapples   Penicillins Hives and Rash   Hydralazine  Other (See Comments)    When given in any form other than oral patient blood pressure drops very low quickly   Hydralazine  Hcl Nausea And Vomiting    Patient has n/v with IV hydralazine , PO hydralazine  is ok.     REVIEW OF SYSTEMS (Negative unless checked)  Constitutional: [] Weight loss  [] Fever  [] Chills Cardiac: [] Chest pain   [] Chest pressure   [] Palpitations   [] Shortness of breath when laying flat   [] Shortness of breath at rest   [x] Shortness of breath with exertion. Vascular:  [] Pain in legs with walking   [] Pain in legs at rest   [] Pain in legs when laying flat   [] Claudication   [] Pain in feet when walking  [] Pain in feet at rest  [] Pain in feet when laying flat   [] History of DVT   [] Phlebitis   [] Swelling in legs   [] Varicose veins   [] Non-healing ulcers Pulmonary:   [] Uses home oxygen   [] Productive cough   [] Hemoptysis   [] Wheeze  [] COPD   [] Asthma Neurologic:  [] Dizziness  [] Blackouts   [] Seizures   [] History of stroke   [] History of TIA  [] Aphasia   [] Temporary blindness   [] Dysphagia   [] Weakness or numbness in arms   [] Weakness or numbness in legs Musculoskeletal:  [] Arthritis   [] Joint swelling   [] Joint pain   [] Low back pain Hematologic:  [] Easy bruising  [] Easy bleeding   [] Hypercoagulable state   [x] Anemic  [] Hepatitis Gastrointestinal:  [] Blood in stool   [] Vomiting blood  [] Gastroesophageal reflux/heartburn   [] Difficulty swallowing. Genitourinary:  [x] Chronic kidney disease   [] Difficult urination  [] Frequent urination  [] Burning with urination   [] Blood in urine Skin:  [] Rashes   [] Ulcers   [] Wounds Psychological:  [] History of anxiety   []  History of major depression.  Physical Examination  Vitals:   12/12/23 1457 12/12/23 1540  BP: (!) 183/120 (!)  179/113  Pulse: 88 85  Temp: 98.4 F (36.9 C)   TempSrc: Oral   SpO2: 100% 100%   There is no height or weight on file to calculate BMI. Gen: WD/WN, NAD Head: [place]/AT, No temporalis wasting. Ear/Nose/Throat: Hearing grossly intact, nares w/o erythema or drainage, oropharynx w/o Erythema/Exudate,  Eyes: Conjunctiva clear, sclera non-icteric Neck: Trachea midline.  No JVD.  Pulmonary:  Good air movement, respirations not labored, no use of accessory muscles.  Cardiac: RRR, normal S1, S2. Vascular:  Vessel Right Left  Radial Palpable Palpable   Musculoskeletal: M/S  5/5 throughout.  Extremities without ischemic changes.  No deformity or atrophy.  Neurologic: Sensation grossly intact in extremities.  Symmetrical.  Speech is fluent. Motor exam as listed above. Psychiatric: Judgment intact, Mood & affect appropriate for pt's clinical situation. Dermatologic: No rashes or ulcers noted.  No cellulitis or open wounds.    CBC Lab Results  Component Value Date   WBC 9.2 06/14/2023   HGB 8.0 (L) 07/06/2023   HCT 25.2 (L) 06/14/2023   MCV 86.0 06/14/2023   PLT 285 06/14/2023    BMET    Component Value Date/Time   NA 138 06/14/2023 0043   NA 141 03/17/2023 1138   K 4.0 06/14/2023 0043   CL 104 06/14/2023 0043   CO2 22 06/14/2023 0043   GLUCOSE 103 (H) 06/14/2023 0043   BUN 35 (H) 06/14/2023 0043   BUN 30 (H) 03/17/2023 1138   CREATININE 11.41 (H) 06/14/2023 0043   CALCIUM 7.4 (L) 07/06/2023 1200   GFRNONAA 4 (L) 06/14/2023 0043   GFRAA >60 07/17/2019 0453   CrCl cannot be calculated (Patient's most recent lab result is older than the maximum 21 days allowed.).  COAG No results found for: INR, PROTIME  Radiology PERIPHERAL VASCULAR CATHETERIZATION Result Date: 12/12/2023 See surgical note for result.   Assessment/Plan 1.  Complication dialysis device with thrombosis AV access:  Patient's Tunneled catheter is not being used. The patient has a PD catheter that is  functioning well. Therefore, the patient will undergo removal of the tunneled catheter under local anesthesia.  The risks and benefits were described to the patient.  All questions were answered.  The patient agrees to proceed with angiography and intervention. Potassium will be drawn to ensure that it is an appropriate level prior to performing intervention. 2.  End-stage renal disease requiring hemodialysis:  Patient will continue dialysis therapy without further interruption if a successful intervention is not achieved then a tunneled catheter will be placed. Dialysis has already been arranged. 3.  Hypertension:  Patient will continue medical management; nephrology is following no changes in oral medications.     Selinda Gu, MD  01/02/2024 1:07 PM

## 2024-01-03 DIAGNOSIS — Z992 Dependence on renal dialysis: Secondary | ICD-10-CM | POA: Diagnosis not present

## 2024-01-05 DIAGNOSIS — Z992 Dependence on renal dialysis: Secondary | ICD-10-CM | POA: Diagnosis not present

## 2024-01-05 DIAGNOSIS — N186 End stage renal disease: Secondary | ICD-10-CM | POA: Diagnosis not present

## 2024-01-13 DIAGNOSIS — Z992 Dependence on renal dialysis: Secondary | ICD-10-CM | POA: Diagnosis not present

## 2024-01-14 DIAGNOSIS — Z992 Dependence on renal dialysis: Secondary | ICD-10-CM | POA: Diagnosis not present

## 2024-01-15 DIAGNOSIS — N186 End stage renal disease: Secondary | ICD-10-CM | POA: Diagnosis not present

## 2024-01-25 DIAGNOSIS — N186 End stage renal disease: Secondary | ICD-10-CM | POA: Diagnosis not present

## 2024-01-25 DIAGNOSIS — D509 Iron deficiency anemia, unspecified: Secondary | ICD-10-CM | POA: Diagnosis not present

## 2024-01-25 DIAGNOSIS — Z992 Dependence on renal dialysis: Secondary | ICD-10-CM | POA: Diagnosis not present

## 2024-01-29 DIAGNOSIS — N186 End stage renal disease: Secondary | ICD-10-CM | POA: Diagnosis not present

## 2024-02-03 ENCOUNTER — Encounter: Payer: Self-pay | Admitting: Family Medicine

## 2024-02-03 DIAGNOSIS — H5213 Myopia, bilateral: Secondary | ICD-10-CM | POA: Diagnosis not present

## 2024-02-03 DIAGNOSIS — Z992 Dependence on renal dialysis: Secondary | ICD-10-CM | POA: Diagnosis not present

## 2024-02-03 DIAGNOSIS — N186 End stage renal disease: Secondary | ICD-10-CM | POA: Diagnosis not present

## 2024-02-04 DIAGNOSIS — Z992 Dependence on renal dialysis: Secondary | ICD-10-CM | POA: Diagnosis not present

## 2024-02-07 DIAGNOSIS — N186 End stage renal disease: Secondary | ICD-10-CM | POA: Diagnosis not present

## 2024-02-07 DIAGNOSIS — Z992 Dependence on renal dialysis: Secondary | ICD-10-CM | POA: Diagnosis not present

## 2024-02-08 DIAGNOSIS — Z992 Dependence on renal dialysis: Secondary | ICD-10-CM | POA: Diagnosis not present

## 2024-02-11 DIAGNOSIS — N186 End stage renal disease: Secondary | ICD-10-CM | POA: Diagnosis not present

## 2024-02-12 DIAGNOSIS — N186 End stage renal disease: Secondary | ICD-10-CM | POA: Diagnosis not present

## 2024-02-12 DIAGNOSIS — Z992 Dependence on renal dialysis: Secondary | ICD-10-CM | POA: Diagnosis not present

## 2024-02-13 DIAGNOSIS — N186 End stage renal disease: Secondary | ICD-10-CM | POA: Diagnosis not present

## 2024-02-13 DIAGNOSIS — Z992 Dependence on renal dialysis: Secondary | ICD-10-CM | POA: Diagnosis not present

## 2024-02-20 ENCOUNTER — Ambulatory Visit (INDEPENDENT_AMBULATORY_CARE_PROVIDER_SITE_OTHER): Admitting: Family Medicine

## 2024-02-20 VITALS — BP 142/88 | HR 80 | Ht 65.0 in | Wt 282.5 lb

## 2024-02-20 DIAGNOSIS — Z992 Dependence on renal dialysis: Secondary | ICD-10-CM | POA: Diagnosis not present

## 2024-02-20 DIAGNOSIS — I151 Hypertension secondary to other renal disorders: Secondary | ICD-10-CM

## 2024-02-20 DIAGNOSIS — N186 End stage renal disease: Secondary | ICD-10-CM | POA: Diagnosis not present

## 2024-02-20 DIAGNOSIS — E66813 Obesity, class 3: Secondary | ICD-10-CM

## 2024-02-20 DIAGNOSIS — G4733 Obstructive sleep apnea (adult) (pediatric): Secondary | ICD-10-CM

## 2024-02-20 DIAGNOSIS — Z6841 Body Mass Index (BMI) 40.0 and over, adult: Secondary | ICD-10-CM

## 2024-02-20 MED ORDER — ZEPBOUND 5 MG/0.5ML ~~LOC~~ SOAJ: 5.0000 mg | SUBCUTANEOUS | 0 refills | Status: AC

## 2024-02-20 MED ORDER — CLONIDINE 0.2 MG/24HR TD PTWK: 0.2000 mg | MEDICATED_PATCH | TRANSDERMAL | 3 refills | Status: AC

## 2024-02-20 MED ORDER — ZEPBOUND 7.5 MG/0.5ML ~~LOC~~ SOAJ: 7.5000 mg | SUBCUTANEOUS | 0 refills | Status: AC

## 2024-02-20 NOTE — Patient Instructions (Signed)
 To keep you healthy, please keep in mind the following health maintenance items that you are due for:   Health Maintenance Due  Topic Date Due   HPV VACCINES (1 - 3-dose SCDM series) Never done   Cervical Cancer Screening (HPV/Pap Cotest)  12/30/2022   Influenza Vaccine  10/14/2023     Best Wishes,   Dr. Lang

## 2024-02-20 NOTE — Progress Notes (Signed)
 Established patient visit   Patient: Sierra Lee   DOB: 01-13-1991   33 y.o. Female  MRN: [id number] Visit Date: 02/20/2024  Today's healthcare provider: Rockie Agent, MD   Chief Complaint  Patient presents with   Medical Management of Chronic Issues    Patient is present for medication follow up, insurance would not cover wegovy  so wants to discuss other options    Subjective     HPI     Medical Management of Chronic Issues    Additional comments: Patient is present for medication follow up, insurance would not cover wegovy  so wants to discuss other options       Last edited by Cherry Chiquita HERO, CMA on 02/20/2024  1:30 PM.       Discussed the use of AI scribe software for clinical note transcription with the patient, who gave verbal consent to proceed.  History of Present Illness Sierra Lee is a 33 year old female with end stage renal disease on dialysis who presents for follow-up of chronic hypertension and kidney transplant evaluation.  She manages her end stage renal disease with peritoneal dialysis at home and is scheduled for a kidney transplant evaluation at [facility]. She tolerates the dialysis well and continues regular follow-ups with her nephrologist.  Her current antihypertensive medications include amlodipine  10 mg daily, clonidine  0.2 mg patches weekly, clonidine  0.2 mg three times daily, hydralazine  25 mg twice daily, and losartan  100 mg daily. She also takes torsemide 100 mg daily for both ESRD and hypertension.  She has severe sleep apnea and class III obesity. She has lost 58 pounds with the use of semaglutide .     Past Medical History:  Diagnosis Date   Allergy    Anemia    Dialysis patient    M, W, F   Dyspnea    ESRD (end stage renal disease) (HCC)    Former cigarette smoker    GERD (gastroesophageal reflux disease)    Hypertension    Morbid obesity (HCC)    OSA on CPAP     Medications: Outpatient Medications Prior to  Visit  Medication Sig Note   acetaminophen  (TYLENOL ) 325 MG tablet Take 2 tablets (650 mg total) by mouth every 6 (six) hours as needed for mild pain (pain score 1-3) (or Fever >/= 101).    albuterol  (VENTOLIN  HFA) 108 (90 Base) MCG/ACT inhaler Inhale 2 puffs into the lungs every 6 (six) hours as needed for wheezing or shortness of breath.    amLODipine  (NORVASC ) 10 MG tablet Take 1 tablet (10 mg total) by mouth daily. (Patient taking differently: Take 10 mg by mouth every morning.)    calcitRIOL (ROCALTROL) 0.25 MCG capsule Take 0.25 mcg by mouth daily.    calcium acetate (PHOSLO) 667 MG capsule Take 667 mg by mouth 3 (three) times daily with meals.    cloNIDine  (CATAPRES ) 0.2 MG tablet Take 1 tablet (0.2 mg total) by mouth 3 (three) times daily.    hydrALAZINE  (APRESOLINE ) 25 MG tablet Take 1 tablet (25 mg total) by mouth 2 (two) times daily.    losartan  (COZAAR ) 100 MG tablet Take 1 tablet by mouth daily.    multivitamin (RENA-VIT) TABS tablet Take 1 tablet by mouth daily.    torsemide (DEMADEX) 100 MG tablet Take 100 mg by mouth every morning.    [DISCONTINUED] cloNIDine  (CATAPRES  - DOSED IN MG/24 HR) 0.2 mg/24hr patch Place 1 patch (0.2 mg total) onto the skin once a week.    [  DISCONTINUED] Semaglutide -Weight Management (WEGOVY ) 1.7 MG/0.75ML SOAJ Inject 1.7 mg into the skin once a week. 02/20/2024: insurance will not cover   [DISCONTINUED] Semaglutide -Weight Management 1 MG/0.5ML SOAJ Inject 1 mg into the skin once a week. 02/20/2024: insurance will not cover   No facility-administered medications prior to visit.    Review of Systems  Last metabolic panel Lab Results  Component Value Date   GLUCOSE 103 (H) 06/14/2023   NA 138 06/14/2023   K 4.0 06/14/2023   CL 104 06/14/2023   CO2 22 06/14/2023   BUN 35 (H) 06/14/2023   CREATININE 11.41 (H) 06/14/2023   GFRNONAA 4 (L) 06/14/2023   CALCIUM 7.4 (L) 07/06/2023   PHOS 6.6 (H) 03/17/2023   PROT 7.1 06/14/2023   ALBUMIN 2.9 (L)  06/14/2023   LABGLOB 2.7 02/02/2023   AGRATIO 1.0 (L) 08/18/2022   BILITOT 0.7 06/14/2023   ALKPHOS 42 06/14/2023   AST 14 (L) 06/14/2023   ALT 12 06/14/2023   ANIONGAP 12 06/14/2023   Last lipids Lab Results  Component Value Date   CHOL 125 08/18/2022   HDL 36 (L) 08/18/2022   LDLCALC 66 08/18/2022   TRIG 131 08/18/2022   CHOLHDL 3.5 08/18/2022   Last hemoglobin A1c Lab Results  Component Value Date   HGBA1C 5.5 01/26/2023   Last thyroid  functions Lab Results  Component Value Date   TSH 2.290 01/26/2023   FREET4 0.78 (L) 01/26/2023        Objective    BP (!) 142/88 (BP Location: Left Arm, Patient Position: Sitting, Cuff Size: Normal) Comment: manual  Pulse 80   Ht 5' 5 (1.651 m)   Wt 282 lb 8 oz (128.1 kg)   LMP 02/07/2024 (Approximate)   SpO2 100%   BMI 47.01 kg/m  BP Readings from Last 3 Encounters:  02/20/24 (!) 142/88  12/12/23 (!) 179/113  09/29/23 (!) 142/77   Wt Readings from Last 3 Encounters:  02/20/24 282 lb 8 oz (128.1 kg)  09/29/23 299 lb (135.6 kg)  08/29/23 (!) 304 lb (137.9 kg)        Physical Exam Vitals reviewed.  Constitutional:      General: She is not in acute distress.    Appearance: Normal appearance. She is not ill-appearing.  Cardiovascular:     Rate and Rhythm: Normal rate and regular rhythm.  Pulmonary:     Effort: Pulmonary effort is normal. No respiratory distress.     Breath sounds: No wheezing, rhonchi or rales.  Neurological:     Mental Status: She is alert and oriented to person, place, and time.  Psychiatric:        Mood and Affect: Mood normal.        Behavior: Behavior normal.       No results found for any visits on 02/20/24.  Assessment & Plan     Problem List Items Addressed This Visit     ESRD (end stage renal disease) on dialysis (HCC)   End stage renal disease on peritoneal dialysis Chronic condition  End stage renal disease managed with peritoneal dialysis. She is tolerating dialysis well  and is following up with the nephrologist. She is also following up with the transplant team for potential kidney transplant. 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 Previously on semaglutide , but discontinued due to insurance lack of coverage. Plan to switch to semaglutide  for continued weight loss to qualify for kidney transplant. - Switched to semaglutide  5 mg weekly for one month, then increase to 7.5 mg weekly - Recommended 200 to 240 minutes of moderate intensity physical activity weekly      Relevant Medications   tirzepatide  (ZEPBOUND ) 5 MG/0.5ML Pen   tirzepatide  (ZEPBOUND ) 7.5 MG/0.5ML Pen   Primary hypertension   Chronic condition  Hypertension secondary to renal disorder, managed with multiple antihypertensive medications. Blood pressure is improved. - Continue amlodipine  10 mg daily - Continue clonidine  0.2 mg patches weekly - Continue clonidine  0.2 mg three times daily - Continue hydralazine  25 mg twice daily - Continue losartan  100 mg daily      Relevant Medications   cloNIDine  (CATAPRES  - DOSED IN MG/24 HR) 0.2 mg/24hr patch   Severe obstructive sleep apnea - Primary   Chronic  Stable with CPAP use  Patient advised to continue CPAP nightly  The patient has had significant benefit from the use of CPAP machine with considerable improvements in quality of life, work production and decrease medical complaints.  -will contact DME company to expedite replacement for broken CPAP machine part  The patient will need continued maintenance and care CPAP supplies (including upgrades as deemed appropriate) in order to continue treatment for sleep apnea as this is medically  necessary.        Relevant Medications   tirzepatide  (ZEPBOUND ) 5 MG/0.5ML Pen   tirzepatide  (ZEPBOUND ) 7.5 MG/0.5ML Pen   Assessment & Plan     Return in about 3 months (around 05/20/2024) for severe OSA (zepbound ).       Rockie Agent, MD  [facility] [phone] (phone) [phone] (fax)  [facility]

## 2024-02-20 NOTE — Assessment & Plan Note (Signed)
 Chronic condition  Hypertension secondary to renal disorder, managed with multiple antihypertensive medications. Blood pressure is improved. - Continue amlodipine  10 mg daily - Continue clonidine  0.2 mg patches weekly - Continue clonidine  0.2 mg three times daily - Continue hydralazine  25 mg twice daily - Continue losartan  100 mg daily

## 2024-02-20 NOTE — Assessment & Plan Note (Signed)
 Chronic  Stable with CPAP use  Patient advised to continue CPAP nightly  The patient has had significant benefit from the use of CPAP machine with considerable improvements in quality of life, work production and decrease medical complaints.  -will contact DME company to expedite replacement for broken CPAP machine part  The patient will need continued maintenance and care CPAP supplies (including upgrades as deemed appropriate) in order to continue treatment for sleep apnea as this is medically necessary.

## 2024-02-20 NOTE — Assessment & Plan Note (Signed)
 End stage renal disease on peritoneal dialysis Chronic condition  End stage renal disease managed with peritoneal dialysis. She is tolerating dialysis well and is following up with the nephrologist. She is also following up with the transplant team for potential kidney transplant. - Continue peritoneal dialysis - Continue calcitriol 0.25 mg daily - Continue Fusilev 667 mg three times daily - Continue torsemide 100 mg daily - Follow up with nephrologist - Follow up with transplant team

## 2024-02-20 NOTE — Assessment & Plan Note (Signed)
 Chronic condition  Class III obesity with significant weight loss of 58 pounds. Previously on semaglutide , but discontinued due to insurance lack of coverage. Plan to switch to semaglutide  for continued weight loss to qualify for kidney transplant. - Switched to semaglutide  5 mg weekly for one month, then increase to 7.5 mg weekly - Recommended 200 to 240 minutes of moderate intensity physical activity weekly

## 2024-02-21 ENCOUNTER — Telehealth: Payer: Self-pay | Admitting: Pharmacy Technician

## 2024-02-21 ENCOUNTER — Other Ambulatory Visit (HOSPITAL_COMMUNITY): Payer: Self-pay

## 2024-02-21 ENCOUNTER — Telehealth: Payer: Self-pay | Admitting: Family Medicine

## 2024-02-21 ENCOUNTER — Telehealth: Payer: Self-pay

## 2024-02-21 DIAGNOSIS — E66813 Obesity, class 3: Secondary | ICD-10-CM | POA: Diagnosis not present

## 2024-02-21 DIAGNOSIS — I120 Hypertensive chronic kidney disease with stage 5 chronic kidney disease or end stage renal disease: Secondary | ICD-10-CM | POA: Diagnosis not present

## 2024-02-21 DIAGNOSIS — G4733 Obstructive sleep apnea (adult) (pediatric): Secondary | ICD-10-CM | POA: Diagnosis not present

## 2024-02-21 DIAGNOSIS — I10 Essential (primary) hypertension: Secondary | ICD-10-CM | POA: Diagnosis not present

## 2024-02-21 DIAGNOSIS — N186 End stage renal disease: Secondary | ICD-10-CM | POA: Diagnosis not present

## 2024-02-21 DIAGNOSIS — Z6841 Body Mass Index (BMI) 40.0 and over, adult: Secondary | ICD-10-CM | POA: Diagnosis not present

## 2024-02-21 DIAGNOSIS — K219 Gastro-esophageal reflux disease without esophagitis: Secondary | ICD-10-CM | POA: Diagnosis not present

## 2024-02-21 DIAGNOSIS — Z79899 Other long term (current) drug therapy: Secondary | ICD-10-CM | POA: Diagnosis not present

## 2024-02-21 DIAGNOSIS — Z880 Allergy status to penicillin: Secondary | ICD-10-CM | POA: Diagnosis not present

## 2024-02-21 DIAGNOSIS — E039 Hypothyroidism, unspecified: Secondary | ICD-10-CM | POA: Diagnosis not present

## 2024-02-21 NOTE — Telephone Encounter (Signed)
 Copied from CRM #[id number]. Topic: General - Other >> Feb 21, 2024 10:24 AM Tonda B wrote: Reason for CRM:  patient calling saying that her medicare will no longer cover the rx zepbound  and wants to know if she can get on wegovy  please call [phone]

## 2024-02-21 NOTE — Telephone Encounter (Signed)
 Copied from CRM #[id number]. Topic: Clinical - Prescription Issue >> Feb 21, 2024 10:03 AM Amber H wrote: Reason for CRM: Patient called and stated she was trying to get her tirzepatide  (ZEPBOUND ) 7.5 MG/0.5ML Pen [[id number]] and tirzepatide  (ZEPBOUND ) 5 MG/0.5ML Pen [[id number]] both refilled however, the pharmacy satted she needed a PA from provider. I advised to patient that provider signed off on prescription yesterday and there is not a note in file showing a PA was needed. Patient stated pharmacy sent over a PA from to provider yesterday. I did recommend that she reached out to her insurance. She is requesting a call back.   Sherlon- [phone]

## 2024-02-21 NOTE — Telephone Encounter (Signed)
 Message sent to RX pa team in separate encounter

## 2024-02-21 NOTE — Telephone Encounter (Signed)
 Pharmacy Patient Advocate Encounter   Received notification from CC charts that prior authorization for Zepbound  5mg  is required/requested.   Insurance verification completed.   The patient is insured through [facility] [place] [employer].   Per test claim: PA required; PA started via CoverMyMeds. KEY [id number] . Waiting for clinical questions to populate.

## 2024-02-21 NOTE — Progress Notes (Signed)
 PA request has been Received. New Encounter has been or will be created for follow up. For additional info see Pharmacy Prior Auth telephone encounter from 02/21/24.

## 2024-02-21 NOTE — Telephone Encounter (Signed)
 PA request has been Received. New Encounter has been or will be created for follow up. For additional info see Pharmacy Prior Auth telephone encounter from 02/21/24.

## 2024-02-21 NOTE — Telephone Encounter (Signed)
 Pharmacy Patient Advocate Encounter- Clarification needed!- please update chart notes as necessary for accurate documentation for PA request.    Per test claim: PA required; PA started via CoverMyMeds. KEY [id number] . Please see clinical question(s) below that I am not finding the answer to in their chart and advise.  1- Will the member discontinue or avoid use of all other strengths of this medication and/or GLP-1 receptor agonist related medications (e.g., exenatide, liraglutide  [Victoza], Mounjaro, Ozempic , Rybelsus , Saxenda , Trulicity, Wegovy )?   2 - Has the member been previously approved for the requested medication through Medicaid's prior authorization process for the covered indications that went into effect December 14, 2023?   It appears the pt has been on 2 different strengths of Wegovy  (concurrently) in the past, but her dispense history last shows her getting it in July. Are you trying to get Zepbound  approved at 2 different strengths to take concurrently or is she titrating up? Her last office visit note makes it sound like she's staying on Wegovy  (semaglutide ) but then Zepbound  (tirzepatide ) is ordered. We also received a message that the pt's ins no longer covers Zepbound , so she wants to go back to Wegovy . If she's been on Zepbound , how has she been receiving the medication?

## 2024-02-22 ENCOUNTER — Other Ambulatory Visit (HOSPITAL_COMMUNITY): Payer: Self-pay

## 2024-02-23 ENCOUNTER — Other Ambulatory Visit (HOSPITAL_COMMUNITY): Payer: Self-pay

## 2024-02-23 NOTE — Telephone Encounter (Signed)
 Thanks. I'll finish up the PA.

## 2024-02-23 NOTE — Telephone Encounter (Signed)
 Clinical questions have been answered and PA submitted. PA currently Pending.

## 2024-02-23 NOTE — Telephone Encounter (Signed)
 Pharmacy Patient Advocate Encounter  Received notification from [employer] that Prior Authorization for Zepbound  has been APPROVED from 02/23/24 to 08/21/24. Ran test claim, Copay is $4. This test claim was processed through [facility]- copay amounts may vary at other pharmacies due to pharmacy/plan contracts, or as the patient moves through the different stages of their insurance plan.   PA #/Case ID/Reference #: [id number]

## 2024-02-24 NOTE — Telephone Encounter (Signed)
 Copied from CRM #[id number]. Topic: Clinical - Prescription Issue >> Feb 24, 2024  9:11 AM Selinda RAMAN wrote: Reason for CRM: The patient called in stating only the stronger doses of the Zepbound  were called in and she was told by the pharmacist she needs to start with the 2.5 dose and not the 5 or 7.5. She is requesting for the 2.5 to be called into her pharmacy. She uses  [facility] [address] ([address]), [place] - [address]  Phone: [phone] Fax: [phone]   Please assist patient further.

## 2024-02-24 NOTE — Telephone Encounter (Signed)
 Spoke with pharmacy staff and clarified that patient last took wegovy  1.7 mg and due to lack of coverage by insurance she was switched over to zepbound  where the provider would like her to start at 5 mg and titrate up. She verbalized understanding and states she can do a override she just wanted a clear understanding

## 2024-02-28 ENCOUNTER — Other Ambulatory Visit (HOSPITAL_COMMUNITY): Payer: Self-pay

## 2024-02-28 ENCOUNTER — Other Ambulatory Visit: Payer: Self-pay

## 2024-02-28 DIAGNOSIS — G4733 Obstructive sleep apnea (adult) (pediatric): Secondary | ICD-10-CM

## 2024-02-28 MED ORDER — ZEPBOUND 2.5 MG/0.5ML ~~LOC~~ SOAJ: 2.5000 mg | SUBCUTANEOUS | 1 refills | Status: AC

## 2024-03-01 ENCOUNTER — Other Ambulatory Visit (HOSPITAL_COMMUNITY): Payer: Self-pay

## 2024-03-04 DIAGNOSIS — Z992 Dependence on renal dialysis: Secondary | ICD-10-CM | POA: Diagnosis not present

## 2024-03-04 DIAGNOSIS — N186 End stage renal disease: Secondary | ICD-10-CM | POA: Diagnosis not present

## 2024-03-05 ENCOUNTER — Other Ambulatory Visit (HOSPITAL_COMMUNITY): Payer: Self-pay

## 2024-03-06 ENCOUNTER — Other Ambulatory Visit (HOSPITAL_COMMUNITY): Payer: Self-pay

## 2024-03-11 ENCOUNTER — Encounter: Payer: Self-pay | Admitting: Family Medicine

## 2024-03-11 DIAGNOSIS — N186 End stage renal disease: Secondary | ICD-10-CM

## 2024-03-11 DIAGNOSIS — E66813 Obesity, class 3: Secondary | ICD-10-CM

## 2024-03-12 DIAGNOSIS — N186 End stage renal disease: Secondary | ICD-10-CM | POA: Diagnosis not present

## 2024-03-12 DIAGNOSIS — Z992 Dependence on renal dialysis: Secondary | ICD-10-CM | POA: Diagnosis not present

## 2024-03-12 MED ORDER — NEPRO PO LIQD: 1.0000 | Freq: Two times a day (BID) | ORAL | 2 refills | Status: AC

## 2024-03-20 ENCOUNTER — Encounter: Payer: Self-pay | Admitting: Family Medicine
